# Patient Record
Sex: Male | Born: 1960 | Race: White | Hispanic: No | Marital: Married | Smoking: Former smoker
Health system: Southern US, Community
[De-identification: ages and names within clinical notes are randomized; demographics above are authoritative.]

## PROBLEM LIST (undated history)

## (undated) DIAGNOSIS — Z87442 Personal history of urinary calculi: Secondary | ICD-10-CM

## (undated) DIAGNOSIS — K219 Gastro-esophageal reflux disease without esophagitis: Secondary | ICD-10-CM

## (undated) DIAGNOSIS — N200 Calculus of kidney: Secondary | ICD-10-CM

## (undated) DIAGNOSIS — K4020 Bilateral inguinal hernia, without obstruction or gangrene, not specified as recurrent: Secondary | ICD-10-CM

## (undated) HISTORY — PX: VASECTOMY: SHX75

## (undated) HISTORY — PX: HERNIA REPAIR: SHX51

---

## 2005-05-03 ENCOUNTER — Ambulatory Visit: Payer: Self-pay | Admitting: Unknown Physician Specialty

## 2005-05-10 ENCOUNTER — Ambulatory Visit: Payer: Self-pay | Admitting: Unknown Physician Specialty

## 2006-10-28 ENCOUNTER — Ambulatory Visit: Payer: Self-pay

## 2006-10-28 ENCOUNTER — Other Ambulatory Visit: Payer: Self-pay

## 2015-03-01 ENCOUNTER — Emergency Department
Admission: EM | Admit: 2015-03-01 | Discharge: 2015-03-01 | Disposition: A | Discharge status: Home / Self Care | Payer: 59 | Attending: Emergency Medicine | Admitting: Emergency Medicine

## 2015-03-01 ENCOUNTER — Emergency Department: Payer: 59

## 2015-03-01 ENCOUNTER — Encounter: Payer: Self-pay | Admitting: Urgent Care

## 2015-03-01 ENCOUNTER — Observation Stay
Admission: EM | Admit: 2015-03-01 | Discharge: 2015-03-02 | Disposition: A | Discharge status: Home / Self Care | Payer: 59 | Source: Home / Self Care | Attending: Emergency Medicine | Admitting: Emergency Medicine

## 2015-03-01 ENCOUNTER — Encounter: Payer: Self-pay | Admitting: *Deleted

## 2015-03-01 DIAGNOSIS — R109 Unspecified abdominal pain: Secondary | ICD-10-CM

## 2015-03-01 DIAGNOSIS — Z9852 Vasectomy status: Secondary | ICD-10-CM | POA: Diagnosis not present

## 2015-03-01 DIAGNOSIS — R918 Other nonspecific abnormal finding of lung field: Secondary | ICD-10-CM | POA: Insufficient documentation

## 2015-03-01 DIAGNOSIS — N200 Calculus of kidney: Secondary | ICD-10-CM

## 2015-03-01 DIAGNOSIS — N201 Calculus of ureter: Secondary | ICD-10-CM | POA: Diagnosis present

## 2015-03-01 DIAGNOSIS — N281 Cyst of kidney, acquired: Secondary | ICD-10-CM | POA: Insufficient documentation

## 2015-03-01 DIAGNOSIS — R509 Fever, unspecified: Secondary | ICD-10-CM | POA: Diagnosis not present

## 2015-03-01 DIAGNOSIS — K219 Gastro-esophageal reflux disease without esophagitis: Secondary | ICD-10-CM | POA: Diagnosis not present

## 2015-03-01 DIAGNOSIS — N132 Hydronephrosis with renal and ureteral calculous obstruction: Secondary | ICD-10-CM | POA: Insufficient documentation

## 2015-03-01 DIAGNOSIS — N179 Acute kidney failure, unspecified: Secondary | ICD-10-CM | POA: Diagnosis not present

## 2015-03-01 HISTORY — DX: Calculus of kidney: N20.0

## 2015-03-01 HISTORY — DX: Gastro-esophageal reflux disease without esophagitis: K21.9

## 2015-03-01 HISTORY — DX: Bilateral inguinal hernia, without obstruction or gangrene, not specified as recurrent: K40.20

## 2015-03-01 LAB — URINALYSIS COMPLETE WITH MICROSCOPIC (ARMC ONLY)
BILIRUBIN URINE: NEGATIVE
Bacteria, UA: NONE SEEN
Bacteria, UA: NONE SEEN
Bilirubin Urine: NEGATIVE
GLUCOSE, UA: 50 mg/dL — AB
Glucose, UA: NEGATIVE mg/dL
KETONES UR: NEGATIVE mg/dL
Leukocytes, UA: NEGATIVE
Leukocytes, UA: NEGATIVE
NITRITE: NEGATIVE
Nitrite: NEGATIVE
PH: 5 (ref 5.0–8.0)
PH: 5 (ref 5.0–8.0)
PROTEIN: NEGATIVE mg/dL
Protein, ur: NEGATIVE mg/dL
SPECIFIC GRAVITY, URINE: 1.009 (ref 1.005–1.030)
SQUAMOUS EPITHELIAL / LPF: NONE SEEN
Specific Gravity, Urine: 1.014 (ref 1.005–1.030)
Squamous Epithelial / LPF: NONE SEEN
WBC UA: NONE SEEN WBC/hpf (ref 0–5)

## 2015-03-01 LAB — BASIC METABOLIC PANEL
ANION GAP: 13 (ref 5–15)
Anion gap: 8 (ref 5–15)
BUN: 19 mg/dL (ref 6–20)
BUN: 22 mg/dL — AB (ref 6–20)
CALCIUM: 8.8 mg/dL — AB (ref 8.9–10.3)
CHLORIDE: 102 mmol/L (ref 101–111)
CO2: 23 mmol/L (ref 22–32)
CO2: 27 mmol/L (ref 22–32)
Calcium: 8.7 mg/dL — ABNORMAL LOW (ref 8.9–10.3)
Chloride: 103 mmol/L (ref 101–111)
Creatinine, Ser: 1.27 mg/dL — ABNORMAL HIGH (ref 0.61–1.24)
Creatinine, Ser: 2.03 mg/dL — ABNORMAL HIGH (ref 0.61–1.24)
GFR calc Af Amer: >60 mL/min (ref >60)
GFR calc non Af Amer: 35 mL/min — ABNORMAL LOW (ref >60)
GFR, EST AFRICAN AMERICAN: 40 mL/min — AB (ref >60)
GLUCOSE: 209 mg/dL — AB (ref 65–99)
Glucose, Bld: 134 mg/dL — ABNORMAL HIGH (ref 65–99)
POTASSIUM: 2.8 mmol/L — AB (ref 3.5–5.1)
POTASSIUM: 4 mmol/L (ref 3.5–5.1)
SODIUM: 139 mmol/L (ref 135–145)
SODIUM: 137 mmol/L (ref 135–145)

## 2015-03-01 LAB — CBC
HCT: 49.6 % (ref 40.0–52.0)
HEMATOCRIT: 47.5 % (ref 40.0–52.0)
HEMOGLOBIN: 15.9 g/dL (ref 13.0–18.0)
Hemoglobin: 16.8 g/dL (ref 13.0–18.0)
MCH: 29.9 pg (ref 26.0–34.0)
MCH: 29.3 pg (ref 26.0–34.0)
MCHC: 33.9 g/dL (ref 32.0–36.0)
MCHC: 33.6 g/dL (ref 32.0–36.0)
MCV: 88.3 fL (ref 80.0–100.0)
MCV: 87.3 fL (ref 80.0–100.0)
PLATELETS: 227 10*3/uL (ref 150–440)
Platelets: 164 10*3/uL (ref 150–440)
RBC: 5.62 MIL/uL (ref 4.40–5.90)
RBC: 5.43 MIL/uL (ref 4.40–5.90)
RDW: 13.5 % (ref 11.5–14.5)
RDW: 13.4 % (ref 11.5–14.5)
WBC: 13.9 10*3/uL — ABNORMAL HIGH (ref 3.8–10.6)
WBC: 14.2 10*3/uL — ABNORMAL HIGH (ref 3.8–10.6)

## 2015-03-01 MED ORDER — SODIUM CHLORIDE 0.9 % IV BOLUS (SEPSIS)
1000.0000 mL | Freq: Once | INTRAVENOUS | Status: AC
  Administered 2015-03-01: 1000 mL via INTRAVENOUS

## 2015-03-01 MED ORDER — OXYCODONE-ACETAMINOPHEN 5-325 MG PO TABS
ORAL_TABLET | ORAL | Status: AC
  Administered 2015-03-01: 1 via ORAL
  Filled 2015-03-01: qty 1

## 2015-03-01 MED ORDER — HYDROMORPHONE HCL 1 MG/ML IJ SOLN
1.0000 mg | Freq: Once | INTRAMUSCULAR | Status: AC
  Administered 2015-03-01: 1 mg via INTRAVENOUS

## 2015-03-01 MED ORDER — HYDROMORPHONE HCL 1 MG/ML IJ SOLN
INTRAMUSCULAR | Status: AC
  Administered 2015-03-01: 1 mg via INTRAVENOUS
  Filled 2015-03-01: qty 1

## 2015-03-01 MED ORDER — KETOROLAC TROMETHAMINE 30 MG/ML IJ SOLN
30.0000 mg | Freq: Once | INTRAMUSCULAR | Status: AC
  Administered 2015-03-01: 30 mg via INTRAVENOUS
  Filled 2015-03-01: qty 1

## 2015-03-01 MED ORDER — ONDANSETRON 4 MG PO TBDP
4.0000 mg | ORAL_TABLET | Freq: Once | ORAL | Status: AC
  Administered 2015-03-01: 4 mg via ORAL

## 2015-03-01 MED ORDER — MORPHINE SULFATE (PF) 4 MG/ML IV SOLN
4.0000 mg | Freq: Once | INTRAVENOUS | Status: AC
  Administered 2015-03-01: 4 mg via INTRAVENOUS

## 2015-03-01 MED ORDER — OXYCODONE-ACETAMINOPHEN 5-325 MG PO TABS: 1.0000 | ORAL_TABLET | Freq: Four times a day (QID) | ORAL | Status: DC | PRN

## 2015-03-01 MED ORDER — POTASSIUM CHLORIDE CRYS ER 20 MEQ PO TBCR
40.0000 meq | EXTENDED_RELEASE_TABLET | Freq: Once | ORAL | Status: AC
  Administered 2015-03-01: 40 meq via ORAL
  Filled 2015-03-01: qty 2

## 2015-03-01 MED ORDER — PROMETHAZINE HCL 25 MG/ML IJ SOLN
12.5000 mg | Freq: Once | INTRAMUSCULAR | Status: AC
  Administered 2015-03-01: 12.5 mg via INTRAVENOUS

## 2015-03-01 MED ORDER — ONDANSETRON HCL 4 MG/2ML IJ SOLN
4.0000 mg | Freq: Once | INTRAMUSCULAR | Status: AC
  Administered 2015-03-01: 4 mg via INTRAVENOUS

## 2015-03-01 MED ORDER — ONDANSETRON HCL 4 MG/2ML IJ SOLN
INTRAMUSCULAR | Status: AC
  Administered 2015-03-01: 4 mg via INTRAVENOUS
  Filled 2015-03-01: qty 2

## 2015-03-01 MED ORDER — ONDANSETRON 4 MG PO TBDP
ORAL_TABLET | ORAL | Status: AC
  Administered 2015-03-01: 4 mg via ORAL
  Filled 2015-03-01: qty 1

## 2015-03-01 MED ORDER — TAMSULOSIN HCL 0.4 MG PO CAPS: 0.4000 mg | ORAL_CAPSULE | Freq: Every day | ORAL | Status: DC

## 2015-03-01 MED ORDER — MORPHINE SULFATE (PF) 4 MG/ML IV SOLN
4.0000 mg | Freq: Once | INTRAVENOUS | Status: AC
  Administered 2015-03-01: 4 mg via INTRAVENOUS
  Filled 2015-03-01: qty 1

## 2015-03-01 MED ORDER — PROMETHAZINE HCL 25 MG/ML IJ SOLN
12.5000 mg | Freq: Once | INTRAMUSCULAR | Status: AC
  Administered 2015-03-01: 12.5 mg via INTRAVENOUS
  Filled 2015-03-01: qty 1

## 2015-03-01 MED ORDER — OXYCODONE-ACETAMINOPHEN 5-325 MG PO TABS
1.0000 | ORAL_TABLET | Freq: Once | ORAL | Status: AC
  Administered 2015-03-01: 1 via ORAL

## 2015-03-01 MED ORDER — ONDANSETRON 4 MG PO TBDP: 4.0000 mg | ORAL_TABLET | Freq: Three times a day (TID) | ORAL | Status: DC | PRN

## 2015-03-01 NOTE — ED Provider Notes (Signed)
[facility] Emergency Department Provider Note  ____________________________________________  Time seen: Approximately 152 AM  I have reviewed the triage vital signs and the nursing notes.   HISTORY  Chief Complaint Flank Pain    HPI CASON LUFFMAN is a 54 y.o. male who comes in with left flank pain started at 2330. The patient's wife reports that the pain started out of the blue. The patient also had some nausea and vomiting. He has never had any symptoms or pain like this in the past. The patient has not had any blood in his urine per the patient's wife. The pain radiates around his left flank and goes to his abdomen into his testicle. The patient's wife reports that it came on fast and within 30 minutes he was pale clammy and sweaty. The patient is in some significant tenderness 10 discomfort at this time. He denies any other significant abdominal pain shortness of breath or chest pain but reports that he is having some severe pain.   Past medical history GERD  There are no active problems to display for this patient.   Past surgical history Vasectomy Hernia repair  Current Outpatient Rx  Name  Route  Sig  Dispense  Refill  . ibuprofen (ADVIL,MOTRIN) 200 MG tablet   Oral   Take 200 mg by mouth every 6 (six) hours as needed.         Marland Kitchen omeprazole (PRILOSEC OTC) 20 MG tablet   Oral   Take 20 mg by mouth daily.           Allergies Review of patient's allergies indicates no known allergies.  No family history on file.  Social History Social History  Substance Use Topics  . Smoking status: Not on file  . Smokeless tobacco: Not on file  . Alcohol Use: Not on file    Review of Systems Constitutional: No fever/chills Eyes: No visual changes. ENT: No sore throat. Cardiovascular: Denies chest pain. Respiratory: Denies shortness of breath. Gastrointestinal: Abdominal pain, nausea, vomiting Genitourinary: Negative for  dysuria. Musculoskeletal: Flank pain Skin: Negative for rash. Neurological: Negative for headaches, focal weakness or numbness.  10-point ROS otherwise negative.  ____________________________________________   PHYSICAL EXAM:  VITAL SIGNS: ED Triage Vitals  Enc Vitals Group     BP 03/01/15 0107 157/99 mmHg     Pulse Rate 03/01/15 0107 86     Resp --      Temp --      Temp src --      SpO2 03/01/15 0107 96 %     Weight --      Height --      Head Cir --      Peak Flow --      Pain Score 03/01/15 0107 9     Pain Loc --      Pain Edu? --      Excl. in [place]? --     Constitutional: Alert and oriented. Well appearing and in severe distress. Eyes: Conjunctivae are normal. PERRL. EOMI. Head: Atraumatic. Nose: No congestion/rhinnorhea. Mouth/Throat: Mucous membranes are moist.  Oropharynx non-erythematous. Cardiovascular: Normal rate, regular rhythm. Grossly normal heart sounds.  Good peripheral circulation. Respiratory: Normal respiratory effort.  No retractions. Lungs CTAB. Gastrointestinal: Soft and nontender. No distention. Positive bowel sounds no significant left CVA tenderness to palpation Musculoskeletal: No lower extremity tenderness nor edema.   Neurologic:  Normal speech and language.  Skin:  Skin is warm, dry and intact.  Psychiatric: Mood and affect  are normal. .  ____________________________________________   LABS (all labs ordered are listed, but only abnormal results are displayed)  Labs Reviewed  BASIC METABOLIC PANEL - Abnormal; Notable for the following:    Potassium 2.8 (*)    Glucose, Bld 209 (*)    Creatinine, Ser 1.27 (*)    Calcium 8.8 (*)    All other components within normal limits  CBC - Abnormal; Notable for the following:    WBC 13.9 (*)    All other components within normal limits  URINALYSIS COMPLETEWITH MICROSCOPIC ([facility] ONLY)    ____________________________________________  EKG  None ____________________________________________  RADIOLOGY  CT renal stone study: 4 mm left UVJ stone with mild hydronephrosis, 8 mm right lower lobe pulmonary nodule and partly visible 73mm nodule along the right major fissure ____________________________________________   PROCEDURES  Procedure(s) performed: None  Critical Care performed: No  ____________________________________________   INITIAL IMPRESSION / ASSESSMENT AND PLAN / ED COURSE  Pertinent labs & imaging results that were available during my care of the patient were reviewed by me and considered in my medical decision making (see chart for details).  This is a 54 year old male who comes in with left flank pain. The patient appears to have a 4 mm kidney stone. He did receive 2 doses of morphine, 2 doses of Zofran, Phenergan and Dilaudid. The pain was improved after the dose of Dilaudid. I also give the patient a dose of Toradol to help. The patient's urine does not show any signs of infection he has some mild elevation of his creatinine but is not severe. He did have some hypokalemia but we did give the patient 40 mEq of potassium. The patient will be discharged home to follow-up with urology I will give him some Flomax and pain medicine. ____________________________________________   FINAL CLINICAL IMPRESSION(S) / ED DIAGNOSES  Final diagnoses:  Flank pain, acute      Loney Hering, MD 03/01/15 [phone]

## 2015-03-01 NOTE — ED Notes (Signed)
Patient returned from CT at this time. MD aware that patient back in room and WTBS.

## 2015-03-01 NOTE — ED Provider Notes (Signed)
[facility] Emergency Department Provider Note  ____________________________________________  Time seen: 12:30 AM  I have reviewed the triage vital signs and the nursing notes.   HISTORY  Chief Complaint Fever      HPI Garrett Horton is a 54 y.o. male with history of 4 mm left UVJ stone which was diagnosed yesterday here at [facility] returns to the emergency department today with history of fever which was noted today to be 99.9 axillary. Patient states that his current pain score is mild. Patient denies any nausea or vomiting. She denies any hematuria. Patient states that he took home a urine strainer last night and has not noted any passage of stone.     Past Medical History  Diagnosis Date  . GERD (gastroesophageal reflux disease)   . Kidney stones   . Hernia, inguinal, bilateral     There are no active problems to display for this patient.   Past Surgical History  Procedure Laterality Date  . Vasectomy    . Hernia repair    . Vasectomy      Current Outpatient Rx  Name  Route  Sig  Dispense  Refill  . ibuprofen (ADVIL,MOTRIN) 200 MG tablet   Oral   Take 200 mg by mouth every 6 (six) hours as needed.         Marland Kitchen omeprazole (PRILOSEC OTC) 20 MG tablet   Oral   Take 20 mg by mouth daily.         . ondansetron (ZOFRAN ODT) 4 MG disintegrating tablet   Oral   Take 1 tablet (4 mg total) by mouth every 8 (eight) hours as needed for nausea or vomiting.   20 tablet   0   . oxyCODONE-acetaminophen (ROXICET) 5-325 MG per tablet   Oral   Take 1 tablet by mouth every 6 (six) hours as needed.   12 tablet   0   . tamsulosin (FLOMAX) 0.4 MG CAPS capsule   Oral   Take 1 capsule (0.4 mg total) by mouth daily.   7 capsule   0     Allergies Review of patient's allergies indicates no known allergies.  No family history on file.  Social History Social History  Substance Use Topics  . Smoking status: Never Smoker   . Smokeless  tobacco: None  . Alcohol Use: Yes    Review of Systems  Constitutional: Negative for fever. Eyes: Negative for visual changes. ENT: Negative for sore throat. Cardiovascular: Negative for chest pain. Respiratory: Negative for shortness of breath. Gastrointestinal: Negative for abdominal pain, vomiting and diarrhea. Genitourinary: Negative for dysuria. Musculoskeletal: Negative for back pain. Skin: Negative for rash. Neurological: Negative for headaches, focal weakness or numbness.  10-point ROS otherwise negative.  ____________________________________________   PHYSICAL EXAM:  VITAL SIGNS: ED Triage Vitals  Enc Vitals Group     BP 03/01/15 2153 141/92 mmHg     Pulse Rate 03/01/15 2153 104     Resp 03/01/15 2153 18     Temp 03/01/15 2153 99.1 F (37.3 C)     Temp Source 03/01/15 2153 Oral     SpO2 03/01/15 2153 98 %     Weight 03/01/15 2153 192 lb (87.091 kg)     Height 03/01/15 2153 5\' 10"  (1.778 m)     Head Cir --      Peak Flow --      Pain Score 03/01/15 2155 6     Pain Loc --  Pain Edu? --      Excl. in [place]? --     Constitutional: Alert and oriented. Well appearing and in no distress. Eyes: Conjunctivae are normal. PERRL. Normal extraocular movements. ENT   Head: Normocephalic and atraumatic.   Nose: No congestion/rhinnorhea.   Mouth/Throat: Mucous membranes are moist.   Neck: No stridor. Cardiovascular: Normal rate, regular rhythm. Normal and symmetric distal pulses are present in all extremities. No murmurs, rubs, or gallops. Respiratory: Normal respiratory effort without tachypnea nor retractions. Breath sounds are clear and equal bilaterally. No wheezes/rales/rhonchi. Gastrointestinal: Soft and nontender. No distention. There is no CVA tenderness. Genitourinary: deferred Musculoskeletal: Nontender with normal range of motion in all extremities. No joint effusions.  No lower extremity tenderness nor edema. Neurologic:  Normal speech and  language. No gross focal neurologic deficits are appreciated. Speech is normal.  Skin:  Skin is warm, dry and intact. No rash noted. Psychiatric: Mood and affect are normal. Speech and behavior are normal. Patient exhibits appropriate insight and judgment.  ____________________________________________    LABS (pertinent positives/negatives)  Labs Reviewed  CBC - Abnormal; Notable for the following:    WBC 14.2 (*)    All other components within normal limits  BASIC METABOLIC PANEL - Abnormal; Notable for the following:    Glucose, Bld 134 (*)    BUN 22 (*)    Creatinine, Ser 2.03 (*)    Calcium 8.7 (*)    GFR calc non Af Amer 35 (*)    GFR calc Af Amer 40 (*)    All other components within normal limits  URINALYSIS COMPLETEWITH MICROSCOPIC ([facility] ONLY) - Abnormal; Notable for the following:    Color, Urine STRAW (*)    APPearance CLEAR (*)    Hgb urine dipstick 1+ (*)    All other components within normal limits        INITIAL IMPRESSION / ASSESSMENT AND PLAN / ED COURSE  Pertinent labs & imaging results that were available during my care of the patient were reviewed by me and considered in my medical decision making (see chart for details).  The patient's creatinine increased from 1.27 and 2.03. Given this information in addition to the patient being febrile patient was discussed with Dr. Bess Harvest urologist on-call. Dr. Tresa Moore will evaluate the patient emergency department with plans for a possible left ureteral stent. All clinical findings were discussed with the patient and his wife.  ____________________________________________   FINAL CLINICAL IMPRESSION(S) / ED DIAGNOSES  Final diagnoses:  Kidney stone on left side      Gregor Hams, MD 03/07/15 0745

## 2015-03-01 NOTE — Discharge Instructions (Signed)

## 2015-03-01 NOTE — ED Notes (Signed)
Patient presents to ED 26 in obvious distress. Patient with c/o LEFT flank pain with (+) N/V that began with acute onset tonight at [place]. Patient pale and grossly diaphoretic. Patient unable to remain in a stationary position. Pain with radiated from LEFT lower back into flank and down into testicle. There is no PMH significance for nephrolithiasis. On exam, patient with (+) CVA tenderness noted on the LEFT.

## 2015-03-01 NOTE — ED Notes (Signed)
RN in to administer MD ordered Toradol. Patient observed sleeping. Wife stepping out at this time - reports that patient has been "much more comfortable" and reports that he has been sleeping since the last medication dosing. Wife requesting coffee and to walk some; coffee provided and spouse directed to the lobby. RN returns to bedside. Patient in a RIGHT side lying position with NAD noted. Patient with even and non-labored respirations. Awakened prior to intervention. Pain at a 2/10 at present. Med given. No verbalized needs at this time. Will continue ton monitor.

## 2015-03-01 NOTE — ED Notes (Signed)

## 2015-03-01 NOTE — ED Notes (Signed)
Patient states he was here last night for left flank pain and was diagnosed with a kidney stone. Patient states he was told to come back if he had a fever. Patient states he had a temperature of 99.9 axillary via an animal thermometer according to his wife. Patient state he took one Percocet for flank pain tonight while waiting in the waiting room.

## 2015-03-01 NOTE — ED Notes (Signed)
Patient to CT for stone study at this time.

## 2015-03-01 NOTE — ED Notes (Signed)
MD made aware of potassium result of 2.8; acknowledged. No new orders at this time.

## 2015-03-02 ENCOUNTER — Emergency Department: Payer: 59 | Admitting: Anesthesiology

## 2015-03-02 ENCOUNTER — Encounter: Payer: Self-pay | Admitting: Anesthesiology

## 2015-03-02 ENCOUNTER — Encounter
Admission: EM | Disposition: A | Discharge status: Home / Self Care | Payer: Self-pay | Source: Home / Self Care | Attending: Emergency Medicine

## 2015-03-02 DIAGNOSIS — R109 Unspecified abdominal pain: Secondary | ICD-10-CM | POA: Diagnosis not present

## 2015-03-02 DIAGNOSIS — D72829 Elevated white blood cell count, unspecified: Secondary | ICD-10-CM | POA: Diagnosis not present

## 2015-03-02 DIAGNOSIS — R110 Nausea: Secondary | ICD-10-CM | POA: Diagnosis not present

## 2015-03-02 DIAGNOSIS — N179 Acute kidney failure, unspecified: Secondary | ICD-10-CM | POA: Diagnosis not present

## 2015-03-02 DIAGNOSIS — N132 Hydronephrosis with renal and ureteral calculous obstruction: Secondary | ICD-10-CM | POA: Diagnosis present

## 2015-03-02 DIAGNOSIS — N2889 Other specified disorders of kidney and ureter: Secondary | ICD-10-CM | POA: Diagnosis not present

## 2015-03-02 DIAGNOSIS — N201 Calculus of ureter: Secondary | ICD-10-CM | POA: Diagnosis not present

## 2015-03-02 HISTORY — PX: CYSTOSCOPY WITH STENT PLACEMENT: SHX5790

## 2015-03-02 HISTORY — PX: CYSTOSCOPY W/ RETROGRADES: SHX1426

## 2015-03-02 SURGERY — CYSTOSCOPY, WITH STENT INSERTION
Anesthesia: General | Site: Ureter | Laterality: Left | Wound class: Clean Contaminated

## 2015-03-02 MED ORDER — SODIUM CHLORIDE 0.9 % IR SOLN
Status: DC | PRN
  Administered 2015-03-02: 3000 mL

## 2015-03-02 MED ORDER — CEFTRIAXONE SODIUM 1 G IJ SOLR
1.0000 g | Freq: Once | INTRAMUSCULAR | Status: AC
  Administered 2015-03-02: 1 g via INTRAVENOUS
  Filled 2015-03-02: qty 10

## 2015-03-02 MED ORDER — OXYCODONE HCL 5 MG PO TABS: 5.0000 mg | ORAL_TABLET | Freq: Once | ORAL | Status: DC | PRN

## 2015-03-02 MED ORDER — OXYCODONE HCL 5 MG/5ML PO SOLN: 5.0000 mg | Freq: Once | ORAL | Status: DC | PRN

## 2015-03-02 MED ORDER — SENNOSIDES-DOCUSATE SODIUM 8.6-50 MG PO TABS
1.0000 | ORAL_TABLET | Freq: Two times a day (BID) | ORAL | Status: DC
  Administered 2015-03-02 (×2): 1 via ORAL
  Filled 2015-03-02 (×2): qty 1

## 2015-03-02 MED ORDER — DEXTROSE 5 % IV SOLN
1.0000 g | INTRAVENOUS | Status: DC
  Administered 2015-03-02: 1 g via INTRAVENOUS
  Filled 2015-03-02 (×2): qty 10

## 2015-03-02 MED ORDER — HYDROMORPHONE HCL 1 MG/ML IJ SOLN
0.5000 mg | INTRAMUSCULAR | Status: DC | PRN
  Administered 2015-03-02: 1 mg via INTRAVENOUS
  Filled 2015-03-02: qty 1

## 2015-03-02 MED ORDER — ONDANSETRON 4 MG PO TBDP: 4.0000 mg | ORAL_TABLET | Freq: Three times a day (TID) | ORAL | Status: DC | PRN

## 2015-03-02 MED ORDER — FENTANYL CITRATE (PF) 100 MCG/2ML IJ SOLN
INTRAMUSCULAR | Status: DC | PRN
  Administered 2015-03-02 (×2): 50 ug via INTRAVENOUS

## 2015-03-02 MED ORDER — ACETAMINOPHEN 500 MG PO TABS
1000.0000 mg | ORAL_TABLET | Freq: Three times a day (TID) | ORAL | Status: DC
  Administered 2015-03-02 (×2): 1000 mg via ORAL
  Filled 2015-03-02 (×2): qty 2

## 2015-03-02 MED ORDER — LIDOCAINE HCL (CARDIAC) 20 MG/ML IV SOLN
INTRAVENOUS | Status: DC | PRN
Start: 2015-03-02 — End: 2015-03-02
  Administered 2015-03-02: 30 mg via INTRAVENOUS

## 2015-03-02 MED ORDER — CIPROFLOXACIN HCL 500 MG PO TABS: 500.0000 mg | ORAL_TABLET | Freq: Two times a day (BID) | ORAL | Status: DC

## 2015-03-02 MED ORDER — TAMSULOSIN HCL 0.4 MG PO CAPS
0.4000 mg | ORAL_CAPSULE | Freq: Every day | ORAL | Status: DC
  Administered 2015-03-02: 0.4 mg via ORAL
  Filled 2015-03-02: qty 1

## 2015-03-02 MED ORDER — LACTATED RINGERS IV SOLN
INTRAVENOUS | Status: DC | PRN
  Administered 2015-03-02: 01:00:00 via INTRAVENOUS

## 2015-03-02 MED ORDER — OXYCODONE-ACETAMINOPHEN 5-325 MG PO TABS: 1.0000 | ORAL_TABLET | Freq: Four times a day (QID) | ORAL | Status: DC | PRN

## 2015-03-02 MED ORDER — FENTANYL CITRATE (PF) 100 MCG/2ML IJ SOLN: 25.0000 ug | INTRAMUSCULAR | Status: DC | PRN

## 2015-03-02 MED ORDER — SODIUM CHLORIDE 0.9 % IV SOLN
INTRAVENOUS | Status: DC | PRN
  Administered 2015-03-02: 02:00:00

## 2015-03-02 MED ORDER — MIDAZOLAM HCL 2 MG/2ML IJ SOLN
INTRAMUSCULAR | Status: DC | PRN
  Administered 2015-03-02: 1 mg via INTRAVENOUS

## 2015-03-02 MED ORDER — OMEPRAZOLE MAGNESIUM 20 MG PO TBEC: 20.0000 mg | DELAYED_RELEASE_TABLET | Freq: Every day | ORAL | Status: DC

## 2015-03-02 MED ORDER — KCL IN DEXTROSE-NACL 20-5-0.45 MEQ/L-%-% IV SOLN
INTRAVENOUS | Status: DC
  Administered 2015-03-02: 06:00:00 via INTRAVENOUS
  Filled 2015-03-02 (×3): qty 1000

## 2015-03-02 MED ORDER — PANTOPRAZOLE SODIUM 40 MG PO TBEC
40.0000 mg | DELAYED_RELEASE_TABLET | Freq: Every day | ORAL | Status: DC
  Administered 2015-03-02: 40 mg via ORAL
  Filled 2015-03-02: qty 1

## 2015-03-02 MED ORDER — PROPOFOL 10 MG/ML IV BOLUS
INTRAVENOUS | Status: DC | PRN
  Administered 2015-03-02: 170 mg via INTRAVENOUS

## 2015-03-02 MED ORDER — TAMSULOSIN HCL 0.4 MG PO CAPS: 0.4000 mg | ORAL_CAPSULE | Freq: Every day | ORAL | Status: DC

## 2015-03-02 SURGICAL SUPPLY — 23 items
BAG DRAIN URO TABLE W/ADPT NS (DRAPE) ×3 IMPLANT
BAG HAMPER (MISCELLANEOUS) IMPLANT
BAG URO CATCHER STRL LF (DRAPE) ×3 IMPLANT
CATH INTERMIT  6FR 70CM (CATHETERS) ×3 IMPLANT
CATH URETL OPEN END 6X70 (CATHETERS) ×3 IMPLANT
CLOTH BEACON ORANGE TIMEOUT ST (SAFETY) ×3 IMPLANT
GLOVE BIO SURGEON STRL SZ7.5 (GLOVE) ×3 IMPLANT
GLOVE BIOGEL M STRL SZ7.5 (GLOVE) ×3 IMPLANT
GOWN STRL REUS W/ TWL XL LVL3 (GOWN DISPOSABLE) ×1 IMPLANT
GOWN STRL REUS W/TWL LRG LVL3 (GOWN DISPOSABLE) ×6 IMPLANT
GOWN STRL REUS W/TWL XL LVL3 (GOWN DISPOSABLE) ×2
GUIDEWIRE ANG ZIPWIRE 038X150 (WIRE) ×3 IMPLANT
GUIDEWIRE STR DUAL SENSOR (WIRE) IMPLANT
IV NS IRRIG 3000ML ARTHROMATIC (IV SOLUTION) ×3 IMPLANT
KIT ROOM TURNOVER AP CYSTO (KITS) ×3 IMPLANT
MANIFOLD NEPTUNE II (INSTRUMENTS) ×3 IMPLANT
PACK CYSTO (CUSTOM PROCEDURE TRAY) ×3 IMPLANT
PAD ARMBOARD 7.5X6 YLW CONV (MISCELLANEOUS) ×3 IMPLANT
STENT URET 6FRX26 CONTOUR (STENTS) ×3 IMPLANT
SYR CONTROL 10ML LL (SYRINGE) ×3 IMPLANT
SYRINGE 10CC LL (SYRINGE) ×3 IMPLANT
TOWEL OR 17X26 4PK STRL BLUE (TOWEL DISPOSABLE) ×3 IMPLANT
WATER STERILE IRR 1000ML POUR (IV SOLUTION) ×3 IMPLANT

## 2015-03-02 NOTE — Brief Op Note (Signed)
03/01/2015 - 03/02/2015  1:52 AM  PATIENT:  Jesusita Oka  54 y.o. male  PRE-OPERATIVE DIAGNOSIS:  stones  POST-OPERATIVE DIAGNOSIS:  stones  PROCEDURE:  Procedure(s): CYSTOSCOPY WITH STENT PLACEMENT (Left)  SURGEON:  Surgeon(s) and Role:    * Alexis Frock, MD - Primary  PHYSICIAN ASSISTANT:   ASSISTANTS: none   ANESTHESIA:   general  EBL:     BLOOD ADMINISTERED:none  DRAINS: none   LOCAL MEDICATIONS USED:  NONE  SPECIMEN:  Source of Specimen:  left renal pelvis urine  DISPOSITION OF SPECIMEN:  microbilogy  COUNTS:  YES  TOURNIQUET:  * No tourniquets in log *  DICTATION: .Other Dictation: Dictation Number [id number]  PLAN OF CARE: Admit to inpatient   PATIENT DISPOSITION:  PACU - hemodynamically stable.   Delay start of Pharmacological VTE agent (>24hrs) due to surgical blood loss or risk of bleeding: yes

## 2015-03-02 NOTE — Anesthesia Preprocedure Evaluation (Addendum)
Anesthesia Evaluation  Patient identified by MRN, date of birth, ID band Patient awake    Reviewed: Allergy & Precautions, H&P , NPO status , Patient's Chart, lab work & pertinent test results  History of Anesthesia Complications Negative for: history of anesthetic complications  Airway Mallampati: II  TM Distance: >3 FB Neck ROM: full    Dental  (+) Poor Dentition   Pulmonary    Pulmonary exam normal breath sounds clear to auscultation       Cardiovascular Exercise Tolerance: Good (-) Past MI negative cardio ROS Normal cardiovascular exam Rhythm:regular Rate:Normal     Neuro/Psych negative neurological ROS  negative psych ROS   GI/Hepatic Neg liver ROS, GERD  Controlled,  Endo/Other  negative endocrine ROS  Renal/GU Renal diseasenegative Renal ROS  negative genitourinary   Musculoskeletal   Abdominal   Peds  Hematology negative hematology ROS (+)   Anesthesia Other Findings Past Medical History:   GERD (gastroesophageal reflux disease)                       Kidney stones                                                Hernia, inguinal, bilateral                                 Signs and symptoms suggestive of sleep apnea    Reproductive/Obstetrics negative OB ROS                            Anesthesia Physical Anesthesia Plan  ASA: III  Anesthesia Plan: General LMA   Post-op Pain Management:    Induction:   Airway Management Planned:   Additional Equipment:   Intra-op Plan:   Post-operative Plan:   Informed Consent: I have reviewed the patients History and Physical, chart, labs and discussed the procedure including the risks, benefits and alternatives for the proposed anesthesia with the patient or authorized representative who has indicated his/her understanding and acceptance.   Dental Advisory Given  Plan Discussed with: Anesthesiologist, CRNA and  Surgeon  Anesthesia Plan Comments:         Anesthesia Quick Evaluation

## 2015-03-02 NOTE — ED Notes (Signed)
Patient leaving ED at this time. Clothes and shoes with patient. Yellow colored metal wedding band given to wife.

## 2015-03-02 NOTE — ED Notes (Signed)
OR called to report that they were ready for the patient. Report given. Patient to be undressed and prepared for the OR.

## 2015-03-02 NOTE — Anesthesia Procedure Notes (Signed)
Procedure Name: LMA Insertion Date/Time: 03/02/2015 1:34 AM Performed by: Andria Frames Pre-anesthesia Checklist: Patient identified, Emergency Drugs available, Suction available, Patient being monitored and Timeout performed Patient Re-evaluated:Patient Re-evaluated prior to inductionOxygen Delivery Method: Circle system utilized Preoxygenation: Pre-oxygenation with 100% oxygen Intubation Type: IV induction Ventilation: Mask ventilation without difficulty LMA: LMA inserted LMA Size: 5.0 Number of attempts: 1 Placement Confirmation: positive ETCO2

## 2015-03-02 NOTE — H&P (Signed)
Garrett Horton is an 54 y.o. male.    Chief Complaint: Left Ureteral stone, Acute Renal Failure, Fever + Leukocytosis, Large Left Renal Cyst  HPI:   1 - Left Ureteral stone - 38mm left UVJ stone with moderate hydro by ER CT 8/67 on eval left colickly flank pain and nausea. Only addition stone Rt punctate. Given trial of medical therapy but no interval passage.  2 -  Acute Renal Failure - Cr 2.0 up from baseline <1.5 by ER labs 9/21. Left ureteral stone with hydro and nauseas / poor PO intake.  3 -  Fever + Leukocytosis - Fever to 100.5, WBC with tachycardia and leukocytosis 14k by repeat ER labs 9/21. UA withtou overt infectious paramaters, but no new cough, abd pain, or other localizing sympotms.  4 - Large Left Renal Cyst - 7cm left lower pole renal cyst by non-contrast CT 9/21 incidental on eval left ureteral stone.   5 - Right Lung Mass - incidetnal 2cm peri-hilar mass on stone CT 2016.   PMH sig for right inguinal hernia repair. NO CV disease. NO blood thinners.   Today " Garrett Horton " is seen for above.   Past Medical History  Diagnosis Date  . GERD (gastroesophageal reflux disease)   . Kidney stones   . Hernia, inguinal, bilateral     Past Surgical History  Procedure Laterality Date  . Vasectomy    . Hernia repair    . Vasectomy      No family history on file. Social History:  reports that he has never smoked. He does not have any smokeless tobacco history on file. He reports that he drinks alcohol. His drug history is not on file.  Allergies: No Known Allergies   (Not in a hospital admission)  Results for orders placed or performed during the hospital encounter of 03/01/15 (from the past 48 hour(s))  CBC     Status: Abnormal   Collection Time: 03/01/15 11:18 PM  Result Value Ref Range   WBC 14.2 (H) 3.8 - 10.6 K/uL   RBC 5.43 4.40 - 5.90 MIL/uL   Hemoglobin 15.9 13.0 - 18.0 g/dL   HCT 47.5 40.0 - 52.0 %   MCV 87.3 80.0 - 100.0 fL   MCH 29.3 26.0 - 34.0 pg   MCHC  33.6 32.0 - 36.0 g/dL   RDW 13.4 11.5 - 14.5 %   Platelets 164 150 - 440 K/uL  Basic metabolic panel     Status: Abnormal   Collection Time: 03/01/15 11:18 PM  Result Value Ref Range   Sodium 137 135 - 145 mmol/L   Potassium 4.0 3.5 - 5.1 mmol/L   Chloride 102 101 - 111 mmol/L   CO2 27 22 - 32 mmol/L   Glucose, Bld 134 (H) 65 - 99 mg/dL   BUN 22 (H) 6 - 20 mg/dL   Creatinine, Ser 2.03 (H) 0.61 - 1.24 mg/dL   Calcium 8.7 (L) 8.9 - 10.3 mg/dL   GFR calc non Af Amer 35 (L) >60 mL/min   GFR calc Af Amer 40 (L) >60 mL/min    Comment: (NOTE) The eGFR has been calculated using the CKD EPI equation. This calculation has not been validated in all clinical situations. eGFR's persistently <60 mL/min signify possible Chronic Kidney Disease.    Anion gap 8 5 - 15  Urinalysis complete, with microscopic ([facility] only)     Status: Abnormal   Collection Time: 03/01/15 11:18 PM  Result Value Ref Range   Color,  Urine STRAW (A) YELLOW   APPearance CLEAR (A) CLEAR   Glucose, UA NEGATIVE NEGATIVE mg/dL   Bilirubin Urine NEGATIVE NEGATIVE   Ketones, ur NEGATIVE NEGATIVE mg/dL   Specific Gravity, Urine 1.009 1.005 - 1.030   Hgb urine dipstick 1+ (A) NEGATIVE   pH 5.0 5.0 - 8.0   Protein, ur NEGATIVE NEGATIVE mg/dL   Nitrite NEGATIVE NEGATIVE   Leukocytes, UA NEGATIVE NEGATIVE   RBC / HPF 0-5 0 - 5 RBC/hpf   WBC, UA NONE SEEN 0 - 5 WBC/hpf   Bacteria, UA NONE SEEN NONE SEEN   Squamous Epithelial / LPF NONE SEEN NONE SEEN   Mucous PRESENT    Ct Renal Stone Study  03/01/2015   CLINICAL DATA:  Left flank pain with nausea and vomiting beginning acutely tonight.  EXAM: CT ABDOMEN AND PELVIS WITHOUT CONTRAST  TECHNIQUE: Multidetector CT imaging of the abdomen and pelvis was performed following the standard protocol without IV contrast.  COMPARISON:  None.  FINDINGS: Lower chest and abdominal wall:  Moderate sliding hiatal hernia.  Fatty bilateral inguinal hernia status post repair on the right.  8 mm  pulmonary nodule in the right lower lobe, with indistinct appearance potentially reflecting inflammatory process. Unexpected nodule extending from the right hilum, along the major fissure, 21 mm maximal axial dimension. The larger nodule its tubular appearing could be vascular or bronchial.  Hepatobiliary: No focal liver abnormality.No evidence of biliary obstruction or stone.  Pancreas: Unremarkable.  Spleen: Unremarkable.  Adrenals/Urinary Tract: Negative adrenals. Mild left hydroureteronephrosis secondary to a a 4 mm UVJ stone. Punctate interpolar right nephrolithiasis. No right hydronephrosis.  7 cm left lower pole renal cyst.  Unremarkable bladder.  Reproductive:No pathologic findings.  Stomach/Bowel: No obstruction. No appendicitis. Mild colonic diverticulosis.  Vascular/Lymphatic: No acute vascular abnormality. No mass or adenopathy.  Peritoneal: No ascites or pneumoperitoneum.  Musculoskeletal: No acute abnormalities.  IMPRESSION: 1. 4 mm left UVJ stone with mild hydronephrosis. 2. 8 mm right lower lobe pulmonary nodule and partly visible 21 mm nodule along the right major fissure. Recommend outpatient workup using chest CT with contrast. 3. Punctate right renal calculus. 4. Moderate hiatal hernia.   Electronically Signed   By: Monte Fantasia M.D.   On: 03/01/2015 03:11    Review of Systems  Constitutional: Positive for fever and malaise/fatigue.  HENT: Negative.   Eyes: Negative.   Respiratory: Negative.   Cardiovascular: Negative.   Gastrointestinal: Positive for nausea.  Genitourinary: Positive for flank pain.  Musculoskeletal: Negative.   Skin: Negative.   Neurological: Negative.   Endo/Heme/Allergies: Negative.   Psychiatric/Behavioral: Negative.     Blood pressure 141/92, pulse 104, temperature 99.1 F (37.3 C), temperature source Oral, resp. rate 18, height $RemoveBe'5\' 10"'cfmDvzGSt$  (1.778 m), weight 87.091 kg (192 lb), SpO2 98 %. Physical Exam  Constitutional: He appears well-developed.  HENT:   Head: Normocephalic.  Eyes: Pupils are equal, round, and reactive to light.  Neck: Normal range of motion.  Cardiovascular:  Regular tachycardia  Respiratory: Effort normal.  GI: Soft.  Genitourinary:  Mild left CVAT  Musculoskeletal: Normal range of motion.  Neurological: He is alert.  Skin: Skin is warm.  Psychiatric: He has a normal mood and affect. His behavior is normal. Judgment and thought content normal.     Assessment/Plan  1 - Left Ureteral stone - pt noow with sequelae of acute renal failure and fever. Emergent renal decompression reccomended with stent v. neph tube. Explained to pt that definitive stone management contraindicated as may  worsen in fection in acute setting. He wants to proceed with left ureteral stent. Will need observation afterwards and verify no fevers x 24 hrs or more before DC. Risks, benefits, alternatives discussed.  2 -  Acute Renal Failure - likely cobination pre/renal and left ureteral stone as per aobve.   3 -  Fever + Leukocytosis - concerning for infected obstructe dstone as per above. Will need new UA / UCX following left renal decompression and empiric ABX, prefer rocephin givin compromised GFR.  4 - Large Left Renal Cyst - will need IV contrast axial imaing in elective setting to verify non-neoplastic  5 - Right Lung Mass - DDx includes malignancy. Will need dedicated axial contrast imaging in outpatient setting to help rule out malignancy, this was reiterated to pt.   MANNY, THEODORE 03/02/2015, 12:10 AM

## 2015-03-02 NOTE — ED Notes (Signed)
Consent form placed on chart; not completed due to the fact that the consulting physician has not been to the ED to speak with patient.

## 2015-03-02 NOTE — Discharge Instructions (Signed)
DISCHARGE INSTRUCTIONS FOR KIDNEY STONE/URETERAL STENT   MEDICATIONS:  1.  Resume all your other meds from home - except do not take any extra narcotic pain meds that you may have at home.  2. Take Cipro twice daily for 10 days for your urinary tract infection  ACTIVITY:  1. No strenuous activity x 1week  2. No driving while on narcotic pain medications  3. Drink plenty of water  4. Continue to walk at home - you can still get blood clots when you are at home, so keep active, but don't over do it.  5. May return to work/school tomorrow or when you feel ready   BATHING:  1. You can shower and we recommend daily showers    SIGNS/SYMPTOMS TO CALL:  Please call us if you have a fever greater than 101.5, uncontrolled nausea/vomiting, uncontrolled pain, dizziness, unable to urinate, bloody urine, chest pain, shortness of breath, leg swelling, leg pain, redness around wound, drainage from wound, or any other concerns or questions.   You can reach [place] at [phone]  FOLLOW-UP:  1. You have an appointment in one week at [facility] for re-evaluation and planning of stone removal.

## 2015-03-02 NOTE — Discharge Summary (Signed)
Date of admission: 03/01/2015  Date of discharge: 03/02/2015  Admission diagnosis: infected and obstructed left ureteral stone  Discharge diagnosis: as above, s/p left ureteral stent placement  Secondary diagnoses:  Patient Active Problem List   Diagnosis Date Noted  . Ureteral stone with hydronephrosis 03/02/2015    History and Physical: For full details, please see admission history and physical. Briefly, Garrett Horton is a 54 y.o. year old patient with infected/obstructed left ureteral stone.  He was admitted after undergoing a left ureteral stent placement urgently from the ED.   Hospital Course: Patient tolerated the procedure well.  He was then transferred to the floor after an uneventful PACU stay.  His hospital course was uncomplicated.  On POD#0  he had met discharge criteria: was eating a regular diet, was up and ambulating independently,  pain was well controlled, was voiding without a catheter, and was ready to for discharge.  PE: Filed Vitals:   03/02/15 1257  BP: 146/64  Pulse: 74  Temp: 98.6 F (37 C)  Resp: 16    Intake/Output Summary (Last 24 hours) at 03/02/15 1730 Last data filed at 03/02/15 1400  Gross per 24 hour  Intake    500 ml  Output   1595 ml  Net  -1095 ml   NAD Left CVA tenderness Ext symmetric Laboratory values:   Recent Labs  03/01/15 0117 03/01/15 2318  WBC 13.9* 14.2*  HGB 16.8 15.9  HCT 49.6 47.5    Recent Labs  03/01/15 0117 03/01/15 2318  NA 139 137  K 2.8* 4.0  CL 103 102  CO2 23 27  GLUCOSE 209* 134*  BUN 19 22*  CREATININE 1.27* 2.03*  CALCIUM 8.8* 8.7*   No results for input(s): LABPT, INR in the last 72 hours. No results for input(s): LABURIN in the last 72 hours. No results found for this or any previous visit.  Disposition: Home  Discharge instruction: The patient was instructed to be ambulatory but told to refrain from heavy lifting, strenuous activity, or driving.   Discharge medications:   Medication  List    TAKE these medications        ciprofloxacin 500 MG tablet  Commonly known as:  CIPRO  Take 1 tablet (500 mg total) by mouth 2 (two) times daily.     ibuprofen 200 MG tablet  Commonly known as:  ADVIL,MOTRIN  Take 200 mg by mouth every 6 (six) hours as needed.     omeprazole 20 MG tablet  Commonly known as:  PRILOSEC OTC  Take 20 mg by mouth daily.     ondansetron 4 MG disintegrating tablet  Commonly known as:  ZOFRAN ODT  Take 1 tablet (4 mg total) by mouth every 8 (eight) hours as needed for nausea or vomiting.     oxyCODONE-acetaminophen 5-325 MG per tablet  Commonly known as:  ROXICET  Take 1-2 tablets by mouth every 6 (six) hours as needed.     tamsulosin 0.4 MG Caps capsule  Commonly known as:  FLOMAX  Take 1 capsule (0.4 mg total) by mouth daily.        Followup:      Follow-up Information    Follow up with [facility] In 1 week.   Contact information:   [address] [place] [phone]

## 2015-03-02 NOTE — Progress Notes (Signed)
Patient discharged to home as ordered. Patient instructed to make follow up appointment with urology in one week as ordered. Patient given prescriptions and discharge instructions as ordered. Patient is alert and oriented, ambulates without assistance. IV discontinued site without S/S of infiltration or infection. Patient taken home by his wife.

## 2015-03-02 NOTE — Anesthesia Postprocedure Evaluation (Signed)
  Anesthesia Post-op Note  Patient: Garrett Horton  Procedure(s) Performed: Procedure(s): CYSTOSCOPY WITH STENT PLACEMENT (Left) CYSTOSCOPY WITH RETROGRADE PYELOGRAM (Left)  Anesthesia type:General LMA  Patient location: PACU  Post pain: Pain level controlled  Post assessment: Post-op Vital signs reviewed, Patient's Cardiovascular Status Stable, Respiratory Function Stable, Patent Airway and No signs of Nausea or vomiting  Post vital signs: Reviewed and stable  Last Vitals:  Filed Vitals:   03/02/15 0830  BP: 128/80  Pulse: 77  Temp: 37 C  Resp: 18    Level of consciousness: awake, alert  and patient cooperative  Complications: No apparent anesthesia complications

## 2015-03-02 NOTE — Transfer of Care (Signed)
Immediate Anesthesia Transfer of Care Note  Patient: Garrett Horton  Procedure(s) Performed: Procedure(s): CYSTOSCOPY WITH STENT PLACEMENT (Left) CYSTOSCOPY WITH RETROGRADE PYELOGRAM (Left)  Patient Location: PACU  Anesthesia Type:General  Level of Consciousness: sedated and patient cooperative  Airway & Oxygen Therapy: Patient Spontanous Breathing  Post-op Assessment: Report given to RN and Post -op Vital signs reviewed and stable  Post vital signs: Reviewed and stable  Last Vitals:  Filed Vitals:   03/02/15 0057  BP: 141/93  Pulse: 82  Temp: 36.8 C  Resp: 18    Complications: No apparent anesthesia complications

## 2015-03-02 NOTE — Op Note (Signed)
NAMEKRYSTAL, Garrett Horton  MEDICAL RECORD NO.:  [id number]  LOCATION:  [facility]                         FACILITY:  [facility]  PHYSICIAN:  Alexis Frock, MD     DATE OF BIRTH:  03-12-1961  DATE OF PROCEDURE: 03/02/2015                              OPERATIVE REPORT  DIAGNOSES:  Left ureteral stone, fever and acute renal failure.  PROCEDURES: 1. Cystoscopy with left retrograde pyelogram and interpretation. 2. Left ureteral stent placement, 6 x 26, Contour, no tether.  ESTIMATED BLOOD LOSS:  Nil.  COMPLICATIONS:  None.  SPECIMENS:  Left renal pelvis urine for Gram stain and culture.  INDICATION:  Garrett Horton is a pleasant 54 year old gentleman without significant medical history, who was found on workup of left colicky flank pain yesterday and recently on ER to have a left distal ureteral stone and moderate hydronephrosis at that time.  He had no infectious parameters and was tolerating p.o. intake and he was given a trial of outpatient medical therapy.  He unfortunately represented on March 01, 2015, with refractory symptoms as well as new fevers and acute renal failure by C-arm studies and he had not passes the stone in the interval.  Options were discussed including recommended to do urgent renal decompression and he proceeded with this with left ureteral stent. Informed consent was obtained and placed in the medical record.  DESCRIPTION OF PROCEDURE:  The patient being Garrett Horton, was verified. Procedure being left ureteral stent placement was confirmed.  Procedure was carried out.  Time-out was performed.  Intravenous antibiotics were administered.  General LMA anesthesia introduced.  The patient was placed into a low lithotomy position and sterile field was created by prepping and draping the patient's penis, perineum, and proximal thighs using iodine x3.  Next, cystourethroscopy was performed using a 21- French rigid cystoscope with 30-degree  offset lens.  Inspection of the anterior and posterior urethra was unremarkable.  Inspection of the urinary bladder revealed no diverticula, calcifications, or papular lesions.  The left ureteral orifice was cannulated with a 5-French end- hole catheter and left retrograde pyelogram was obtained.  left retrograde pyelogram demonstrated a single left ureter with single- system left kidney.  There was moderate hydronephrosis to the level of the distal ureter.  Definitive filling defect and stone was not visualized, it was felt to likely represent just very distal stone placement.  Next, a 0.038 Sensor wire was advanced at the level of the renal pelvis, over which, a new 6 x 26 Contour-type stent was placed using the cystoscopic and fluoroscopic guidance.  Good proximal and distal deployment were noted.  With the open-ended catheter in the renal pelvis, a sample of renal pelvis urine was set aside for Gram stain and culture.  There was efflux of urine around into the distal end of the stent.  Bladder was emptied per cystoscope.  Procedure was terminated.  The patient tolerated the procedure well.  There were no immediate periprocedural complications.  The patient was taken to the postanesthesia care unit in stable condition with plan for observation and admission.          ______________________________ Alexis Frock, MD  TM/MEDQ  D:  03/02/2015  T:  03/02/2015  Job:  [id number]

## 2015-03-05 LAB — ANAEROBIC CULTURE

## 2015-03-08 ENCOUNTER — Ambulatory Visit (INDEPENDENT_AMBULATORY_CARE_PROVIDER_SITE_OTHER): Payer: 59 | Admitting: Urology

## 2015-03-08 ENCOUNTER — Encounter: Payer: Self-pay | Admitting: Urology

## 2015-03-08 VITALS — BP 133/93 | HR 100 | Ht 70.0 in | Wt 194.8 lb

## 2015-03-08 DIAGNOSIS — N132 Hydronephrosis with renal and ureteral calculous obstruction: Secondary | ICD-10-CM

## 2015-03-08 NOTE — Progress Notes (Signed)
03/08/2015 2:26 PM   Garrett Horton [birth year] [id number]  Referring provider: No referring provider defined for this encounter.  Chief Complaint  Patient presents with  . Nephrolithiasis    Discuss surgery - New patient    HPI: The patient is a 54 year old male who underwent left ureteral stent placement September 22 for an infected stone. He has done well since that time. He has been afebrile. He still finishing a course of Cipro. His urine cultures were negative, but is unclear with initial culture was taken in relation to antibiotic dosing.   PMH: Past Medical History  Diagnosis Date  . GERD (gastroesophageal reflux disease)   . Kidney stones   . Hernia, inguinal, bilateral     Surgical History: Past Surgical History  Procedure Laterality Date  . Vasectomy    . Hernia repair    . Cystoscopy with stent placement Left 03/02/2015    Procedure: CYSTOSCOPY WITH STENT PLACEMENT;  Surgeon: Alexis Frock, MD;  Location: [facility] ORS;  Service: Urology;  Laterality: Left;  . Cystoscopy w/ retrogrades Left 03/02/2015    Procedure: CYSTOSCOPY WITH RETROGRADE PYELOGRAM;  Surgeon: Alexis Frock, MD;  Location: [facility] OR[facility];  Service: Urology;  Laterality: Left;    Home Medications:    Medication List       This list is accurate as of: 03/08/15  2:26 PM.  Always use your most recent med list.               ciprofloxacin 500 MG tablet  Commonly known as:  CIPRO  Take 1 tablet (500 mg total) by mouth 2 (two) times daily.     ibuprofen 200 MG tablet  Commonly known as:  ADVIL,MOTRIN  Take 200 mg by mouth every 6 (six) hours as needed.     omeprazole 20 MG tablet  Commonly known as:  PRILOSEC OTC  Take 20 mg by mouth daily.     ondansetron 4 MG disintegrating tablet  Commonly known as:  ZOFRAN ODT  Take 1 tablet (4 mg total) by mouth every 8 (eight) hours as needed for nausea or vomiting.     oxyCODONE-acetaminophen 5-325 MG tablet  Commonly known as:  ROXICET  Take 1-2  tablets by mouth every 6 (six) hours as needed.     tamsulosin 0.4 MG Caps capsule  Commonly known as:  FLOMAX  Take 1 capsule (0.4 mg total) by mouth daily.        Allergies: No Known Allergies  Family History: Family History  Problem Relation Age of Onset  . Prostate cancer Neg Hx   . Bladder Cancer Neg Hx   . Nephrolithiasis Father   . Nephrolithiasis Brother     Social History:  reports that he has never smoked. He does not have any smokeless tobacco history on file. He reports that he drinks alcohol. He reports that he does not use illicit drugs.  ROS: UROLOGY Frequent Urination?: No Hard to postpone urination?: Yes Burning/pain with urination?: No Get up at night to urinate?: No Leakage of urine?: No Urine stream starts and stops?: Yes Trouble starting stream?: No Do you have to strain to urinate?: No Blood in urine?: No Urinary tract infection?: No Sexually transmitted disease?: No Injury to kidneys or bladder?: No Painful intercourse?: No Weak stream?: No Erection problems?: No Penile pain?: No  Gastrointestinal Nausea?: No Vomiting?: No Indigestion/heartburn?: Yes Diarrhea?: No Constipation?: No  Constitutional Fever: No Night sweats?: No Weight loss?: No Fatigue?: No  Skin Skin rash/lesions?: No  Itching?: No  Eyes Blurred vision?: No Double vision?: No  Ears/Nose/Throat Sore throat?: No Sinus problems?: No  Hematologic/Lymphatic Swollen glands?: No Easy bruising?: No  Cardiovascular Leg swelling?: No Chest pain?: No  Respiratory Cough?: No Shortness of breath?: No  Endocrine Excessive thirst?: No  Musculoskeletal Back pain?: No Joint pain?: No  Neurological Headaches?: No Dizziness?: No  Psychologic Depression?: No Anxiety?: No  Physical Exam: BP 133/93 mmHg  Pulse 100  Ht 5\' 10"  (1.778 m)  Wt 194 lb 12.8 oz (88.361 kg)  BMI 27.95 kg/m2  Constitutional:  Alert and oriented, No acute distress. HEENT: [place] AT,  moist mucus membranes.  Trachea midline, no masses. Cardiovascular: No clubbing, cyanosis, or edema. Respiratory: Normal respiratory effort, no increased work of breathing. GI: Abdomen is soft, nontender, nondistended, no abdominal masses GU: No CVA tenderness.  Skin: No rashes, bruises or suspicious lesions. Lymph: No cervical or inguinal adenopathy. Neurologic: Grossly intact, no focal deficits, moving all 4 extremities. Psychiatric: Normal mood and affect.  Laboratory Data: Lab Results  Component Value Date   WBC 14.2* 03/01/2015   HGB 15.9 03/01/2015   HCT 47.5 03/01/2015   MCV 87.3 03/01/2015   PLT 164 03/01/2015    Lab Results  Component Value Date   CREATININE 2.03* 03/01/2015    No results found for: PSA  No results found for: TESTOSTERONE  No results found for: HGBA1C  Urinalysis    Component Value Date/Time   COLORURINE STRAW* 03/01/2015 2318   APPEARANCEUR CLEAR* 03/01/2015 2318   LABSPEC 1.009 03/01/2015 2318   PHURINE 5.0 03/01/2015 2318   GLUCOSEU NEGATIVE 03/01/2015 2318   HGBUR 1+* 03/01/2015 2318   [place] NEGATIVE 03/01/2015 2318   [place] NEGATIVE 03/01/2015 2318   PROTEINUR NEGATIVE 03/01/2015 2318   NITRITE NEGATIVE 03/01/2015 2318   LEUKOCYTESUR NEGATIVE 03/01/2015 2318    Pertinent Imaging: Distal left ureteral stone  Assessment & Plan:    1. Left Distal Ureteral stone S/p left ureteral stent -Plan for OR for cystoscopy, left ureteroscopy, laser lithotripsy, stent exchange versus removal -Obtain urine culture today -Patient to complete previously prescribed ten-day course of Cipro  Follow up in [place], [place] [address], [place], [place] [address] [phone]

## 2015-03-09 ENCOUNTER — Telehealth: Payer: Self-pay | Admitting: Radiology

## 2015-03-09 NOTE — Telephone Encounter (Signed)
[facility] re: surgery scheduled 03/21/15.

## 2015-03-10 LAB — CULTURE, URINE COMPREHENSIVE

## 2015-03-10 NOTE — Telephone Encounter (Signed)
LMOM stating pt has pre-admit testing appt on 03/14/15 @8 :30 for surgery scheduled 03/21/15. Requested pt return call.

## 2015-03-13 LAB — MICROSCOPIC EXAMINATION: RENAL EPITHEL UA: NONE SEEN /HPF

## 2015-03-13 LAB — URINALYSIS, COMPLETE
BILIRUBIN UA: NEGATIVE
GLUCOSE, UA: NEGATIVE
KETONES UA: NEGATIVE
Nitrite, UA: NEGATIVE
UUROB: 0.2 mg/dL (ref 0.2–1.0)
pH, UA: 5 (ref 5.0–7.5)

## 2015-03-13 NOTE — Telephone Encounter (Signed)
Wife returned call. Notified her of surgery scheduled 03/21/15, pre-admit testing appt 03/14/15 @8 :30 and to call day prior to surgery for arrival time to [facility]. Advised that pt should be npo after mn day of surgery. Wife voiced understanding.

## 2015-03-14 ENCOUNTER — Encounter
Admission: RE | Admit: 2015-03-14 | Discharge: 2015-03-14 | Disposition: A | Discharge status: Home / Self Care | Payer: No Typology Code available for payment source | Source: Ambulatory Visit | Attending: Urology | Admitting: Urology

## 2015-03-14 DIAGNOSIS — Z01818 Encounter for other preprocedural examination: Secondary | ICD-10-CM | POA: Diagnosis present

## 2015-03-14 DIAGNOSIS — N201 Calculus of ureter: Secondary | ICD-10-CM | POA: Insufficient documentation

## 2015-03-14 LAB — SURGICAL PCR SCREEN
MRSA, PCR: NEGATIVE
STAPHYLOCOCCUS AUREUS: NEGATIVE

## 2015-03-14 NOTE — Pre-Procedure Instructions (Signed)
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

## 2015-03-14 NOTE — Patient Instructions (Signed)
  Your procedure is scheduled on: Tuesday Oct. 11, 2016 Report to Same Day Surgery. To find out your arrival time please call [phone] between 1PM - 3PM on Monday Oct. 10, 2016.  Remember: Instructions that are not followed completely may result in serious medical risk, up to and including death, or upon the discretion of your surgeon and anesthesiologist your surgery may need to be rescheduled.    _x___ 1. Do not eat food or drink liquids after midnight. No gum chewing or hard candies.     _x___ 2. No Alcohol for 24 hours before or after surgery.   ____ 3. Bring all medications with you on the day of surgery if instructed.    _x___ 4. Notify your doctor if there is any change in your medical condition     (cold, fever, infections).     Do not wear jewelry, make-up, hairpins, clips or nail polish.  Do not wear lotions, powders, or perfumes. You may wear deodorant.  Do not shave 48 hours prior to surgery. Men may shave face and neck.  Do not bring valuables to the hospital.    [facility] is not responsible for any belongings or valuables.               Contacts, dentures or bridgework may not be worn into surgery.  Leave your suitcase in the car. After surgery it may be brought to your room.  For patients admitted to the hospital, discharge time is determined by your treatment team.   Patients discharged the day of surgery will not be allowed to drive home.    Please read over the following fact sheets that you were given:   [facility] Preparing for Surgery  __x__ Take these medicines the morning of surgery with A SIP OF WATER:    1. tamsulosin (FLOMAX)  2. omeprazole (PRILOSEC OTC    ____ Fleet Enema (as directed)   ____ Use CHG Soap as directed  ____ Use inhalers on the day of surgery  ____ Stop metformin 2 days prior to surgery    ____ Take 1/2 of usual insulin dose the night before surgery and none on the morning of surgery.   ____ Stop Coumadin/Plavix/aspirin  on does not apply.  __x_Stop Anti-inflammatories Aleve or Ibuprofen  on today.  Can take Tylenol or Oxycodone  for pain if needed.   ____ Stop supplements until after surgery.    ____ Bring C-Pap to the hospital.

## 2015-03-21 ENCOUNTER — Encounter
Admission: RE | Disposition: A | Discharge status: Home / Self Care | Payer: Self-pay | Source: Ambulatory Visit | Attending: Urology

## 2015-03-21 ENCOUNTER — Ambulatory Visit: Payer: 59 | Admitting: Anesthesiology

## 2015-03-21 ENCOUNTER — Encounter: Payer: Self-pay | Admitting: *Deleted

## 2015-03-21 ENCOUNTER — Ambulatory Visit
Admission: RE | Admit: 2015-03-21 | Discharge: 2015-03-21 | Disposition: A | Discharge status: Home / Self Care | Payer: 59 | Source: Ambulatory Visit | Attending: Urology | Admitting: Urology

## 2015-03-21 DIAGNOSIS — N132 Hydronephrosis with renal and ureteral calculous obstruction: Secondary | ICD-10-CM | POA: Diagnosis not present

## 2015-03-21 DIAGNOSIS — N179 Acute kidney failure, unspecified: Secondary | ICD-10-CM | POA: Diagnosis not present

## 2015-03-21 DIAGNOSIS — N201 Calculus of ureter: Secondary | ICD-10-CM | POA: Diagnosis not present

## 2015-03-21 DIAGNOSIS — Z711 Person with feared health complaint in whom no diagnosis is made: Secondary | ICD-10-CM | POA: Diagnosis not present

## 2015-03-21 DIAGNOSIS — N136 Pyonephrosis: Secondary | ICD-10-CM | POA: Diagnosis not present

## 2015-03-21 DIAGNOSIS — K4020 Bilateral inguinal hernia, without obstruction or gangrene, not specified as recurrent: Secondary | ICD-10-CM | POA: Diagnosis not present

## 2015-03-21 DIAGNOSIS — D72829 Elevated white blood cell count, unspecified: Secondary | ICD-10-CM | POA: Insufficient documentation

## 2015-03-21 DIAGNOSIS — Z87891 Personal history of nicotine dependence: Secondary | ICD-10-CM | POA: Diagnosis not present

## 2015-03-21 DIAGNOSIS — K449 Diaphragmatic hernia without obstruction or gangrene: Secondary | ICD-10-CM | POA: Insufficient documentation

## 2015-03-21 DIAGNOSIS — Z87442 Personal history of urinary calculi: Secondary | ICD-10-CM | POA: Diagnosis not present

## 2015-03-21 DIAGNOSIS — R918 Other nonspecific abnormal finding of lung field: Secondary | ICD-10-CM | POA: Insufficient documentation

## 2015-03-21 DIAGNOSIS — K219 Gastro-esophageal reflux disease without esophagitis: Secondary | ICD-10-CM | POA: Diagnosis not present

## 2015-03-21 DIAGNOSIS — Z9852 Vasectomy status: Secondary | ICD-10-CM | POA: Diagnosis not present

## 2015-03-21 DIAGNOSIS — N281 Cyst of kidney, acquired: Secondary | ICD-10-CM | POA: Diagnosis not present

## 2015-03-21 HISTORY — PX: CYSTOSCOPY/URETEROSCOPY/HOLMIUM LASER/STENT PLACEMENT: SHX6546

## 2015-03-21 HISTORY — PX: CYSTOSCOPY W/ RETROGRADES: SHX1426

## 2015-03-21 SURGERY — CYSTOSCOPY/URETEROSCOPY/HOLMIUM LASER/STENT PLACEMENT
Anesthesia: General | Wound class: Clean Contaminated

## 2015-03-21 MED ORDER — FENTANYL CITRATE (PF) 100 MCG/2ML IJ SOLN
INTRAMUSCULAR | Status: DC | PRN
  Administered 2015-03-21: 100 ug via INTRAVENOUS

## 2015-03-21 MED ORDER — BELLADONNA ALKALOIDS-OPIUM 16.2-60 MG RE SUPP
RECTAL | Status: DC | PRN
  Administered 2015-03-21: 1 via RECTAL

## 2015-03-21 MED ORDER — LIDOCAINE HCL 2 % EX GEL
CUTANEOUS | Status: DC | PRN
  Administered 2015-03-21: 1

## 2015-03-21 MED ORDER — METOPROLOL TARTRATE 1 MG/ML IV SOLN
INTRAVENOUS | Status: DC | PRN
  Administered 2015-03-21: 1 mg via INTRAVENOUS

## 2015-03-21 MED ORDER — BELLADONNA ALKALOIDS-OPIUM 16.2-60 MG RE SUPP
RECTAL | Status: AC
  Filled 2015-03-21: qty 1

## 2015-03-21 MED ORDER — GLYCOPYRROLATE 0.2 MG/ML IJ SOLN
INTRAMUSCULAR | Status: DC | PRN
  Administered 2015-03-21: 0.2 mg via INTRAVENOUS

## 2015-03-21 MED ORDER — FENTANYL CITRATE (PF) 100 MCG/2ML IJ SOLN: 25.0000 ug | INTRAMUSCULAR | Status: DC | PRN

## 2015-03-21 MED ORDER — MIDAZOLAM HCL 2 MG/2ML IJ SOLN
INTRAMUSCULAR | Status: DC | PRN
  Administered 2015-03-21: 1 mg via INTRAVENOUS

## 2015-03-21 MED ORDER — LACTATED RINGERS IV SOLN
INTRAVENOUS | Status: DC
  Administered 2015-03-21: 12:00:00 via INTRAVENOUS

## 2015-03-21 MED ORDER — ONDANSETRON HCL 4 MG/2ML IJ SOLN
4.0000 mg | Freq: Once | INTRAMUSCULAR | Status: DC | PRN
Start: 2015-03-21 — End: 2015-03-21

## 2015-03-21 MED ORDER — PROPOFOL 10 MG/ML IV BOLUS
INTRAVENOUS | Status: DC | PRN
  Administered 2015-03-21: 100 mg via INTRAVENOUS

## 2015-03-21 MED ORDER — CIPROFLOXACIN IN D5W 400 MG/200ML IV SOLN
400.0000 mg | Freq: Two times a day (BID) | INTRAVENOUS | Status: DC
Start: 2015-03-21 — End: 2015-03-21
  Administered 2015-03-21: 400 mg via INTRAVENOUS

## 2015-03-21 MED ORDER — IOTHALAMATE MEGLUMINE 43 % IV SOLN
INTRAVENOUS | Status: DC | PRN
  Administered 2015-03-21: 15 mL

## 2015-03-21 MED ORDER — LIDOCAINE HCL 2 % EX GEL
CUTANEOUS | Status: AC
  Filled 2015-03-21: qty 10

## 2015-03-21 MED ORDER — KETOROLAC TROMETHAMINE 30 MG/ML IJ SOLN
INTRAMUSCULAR | Status: DC | PRN
  Administered 2015-03-21: 30 mg via INTRAVENOUS

## 2015-03-21 MED ORDER — CIPROFLOXACIN HCL 500 MG PO TABS: 500.0000 mg | ORAL_TABLET | Freq: Once | ORAL | Status: DC

## 2015-03-21 MED ORDER — LIDOCAINE HCL (CARDIAC) 20 MG/ML IV SOLN
INTRAVENOUS | Status: DC | PRN
  Administered 2015-03-21: 100 mg via INTRAVENOUS

## 2015-03-21 MED ORDER — CIPROFLOXACIN IN D5W 400 MG/200ML IV SOLN
INTRAVENOUS | Status: AC
  Administered 2015-03-21 (×2): 400 mg via INTRAVENOUS
  Filled 2015-03-21: qty 200

## 2015-03-21 SURGICAL SUPPLY — 30 items
CATH FOL LX CONE TIP  8F (CATHETERS)
CATH FOL LX CONE TIP 8F (CATHETERS) IMPLANT
CATH URETL OPEN END 6X70 (CATHETERS) ×4 IMPLANT
CONRAY 43 FOR UROLOGY 50M (MISCELLANEOUS) ×4 IMPLANT
DRSG TEGADERM 2-3/8X2-3/4 SM (GAUZE/BANDAGES/DRESSINGS) ×4 IMPLANT
FEE TECHNICIAN ONLY PER HOUR (MISCELLANEOUS) IMPLANT
GLOVE BIO SURGEON STRL SZ7 (GLOVE) ×8 IMPLANT
GLOVE BIO SURGEON STRL SZ7.5 (GLOVE) ×4 IMPLANT
GOWN L4 LG 24 PK N/S (GOWN DISPOSABLE) ×4 IMPLANT
GOWN STRL REUS W/TWL XL LVL3 (GOWN DISPOSABLE) ×4 IMPLANT
GUIDEWIRE ANG ZIPWIRE 035X150 (WIRE) IMPLANT
INTRODUCER DILATOR DOUBLE (INTRODUCER) IMPLANT
KIT RM TURNOVER CYSTO AR (KITS) ×4 IMPLANT
LASER HOLMIUM FIBER SU 272UM (MISCELLANEOUS) IMPLANT
PACK CYSTO AR (MISCELLANEOUS) ×4 IMPLANT
PREP PVP WINGED SPONGE (MISCELLANEOUS) ×4 IMPLANT
SENSORWIRE 0.038 NOT ANGLED (WIRE) ×4
SET CYSTO W/LG BORE CLAMP LF (SET/KITS/TRAYS/PACK) ×4 IMPLANT
SOL .9 NS 3000ML IRR  AL (IV SOLUTION) ×2
SOL .9 NS 3000ML IRR UROMATIC (IV SOLUTION) ×2 IMPLANT
SOL PREP PVP 2OZ (MISCELLANEOUS) ×4
SOLUTION PREP PVP 2OZ (MISCELLANEOUS) ×2 IMPLANT
STENT URET 6FRX24 CONTOUR (STENTS) IMPLANT
STENT URET 6FRX26 CONTOUR (STENTS) ×4 IMPLANT
SURGILUBE 2OZ TUBE FLIPTOP (MISCELLANEOUS) ×4 IMPLANT
SWABSTK COMLB BENZOIN TINCTURE (MISCELLANEOUS) ×4 IMPLANT
SYRINGE IRR TOOMEY STRL 70CC (SYRINGE) ×4 IMPLANT
TUBING CONNECTING 10 (TUBING) ×3 IMPLANT
TUBING CONNECTING 10' (TUBING) ×1
WIRE SENSOR 0.038 NOT ANGLED (WIRE) ×2 IMPLANT

## 2015-03-21 NOTE — H&P (View-Only) (Signed)
Garrett Horton is an 54 y.o. male.    Chief Complaint: Left Ureteral stone, Acute Renal Failure, Fever + Leukocytosis, Large Left Renal Cyst  HPI:   1 - Left Ureteral stone - 51mm left UVJ stone with moderate hydro by ER CT 1/74 on eval left colickly flank pain and nausea. Only addition stone Rt punctate. Given trial of medical therapy but no interval passage.  2 -  Acute Renal Failure - Cr 2.0 up from baseline <1.5 by ER labs 9/21. Left ureteral stone with hydro and nauseas / poor PO intake.  3 -  Fever + Leukocytosis - Fever to 100.5, WBC with tachycardia and leukocytosis 14k by repeat ER labs 9/21. UA withtou overt infectious paramaters, but no new cough, abd pain, or other localizing sympotms.  4 - Large Left Renal Cyst - 7cm left lower pole renal cyst by non-contrast CT 9/21 incidental on eval left ureteral stone.   5 - Right Lung Mass - incidetnal 2cm peri-hilar mass on stone CT 2016.   PMH sig for right inguinal hernia repair. NO CV disease. NO blood thinners.   Today " [name] " is seen for above.   Past Medical History  Diagnosis Date  . GERD (gastroesophageal reflux disease)   . Kidney stones   . Hernia, inguinal, bilateral     Past Surgical History  Procedure Laterality Date  . Vasectomy    . Hernia repair    . Vasectomy      No family history on file. Social History:  reports that he has never smoked. He does not have any smokeless tobacco history on file. He reports that he drinks alcohol. His drug history is not on file.  Allergies: No Known Allergies   (Not in a hospital admission)  Results for orders placed or performed during the hospital encounter of 03/01/15 (from the past 48 hour(s))  CBC     Status: Abnormal   Collection Time: 03/01/15 11:18 PM  Result Value Ref Range   WBC 14.2 (H) 3.8 - 10.6 K/uL   RBC 5.43 4.40 - 5.90 MIL/uL   Hemoglobin 15.9 13.0 - 18.0 g/dL   HCT 47.5 40.0 - 52.0 %   MCV 87.3 80.0 - 100.0 fL   MCH 29.3 26.0 - 34.0 pg   MCHC  33.6 32.0 - 36.0 g/dL   RDW 13.4 11.5 - 14.5 %   Platelets 164 150 - 440 K/uL  Basic metabolic panel     Status: Abnormal   Collection Time: 03/01/15 11:18 PM  Result Value Ref Range   Sodium 137 135 - 145 mmol/L   Potassium 4.0 3.5 - 5.1 mmol/L   Chloride 102 101 - 111 mmol/L   CO2 27 22 - 32 mmol/L   Glucose, Bld 134 (H) 65 - 99 mg/dL   BUN 22 (H) 6 - 20 mg/dL   Creatinine, Ser 2.03 (H) 0.61 - 1.24 mg/dL   Calcium 8.7 (L) 8.9 - 10.3 mg/dL   GFR calc non Af Amer 35 (L) >60 mL/min   GFR calc Af Amer 40 (L) >60 mL/min    Comment: (NOTE) The eGFR has been calculated using the CKD EPI equation. This calculation has not been validated in all clinical situations. eGFR's persistently <60 mL/min signify possible Chronic Kidney Disease.    Anion gap 8 5 - 15  Urinalysis complete, with microscopic ([facility] only)     Status: Abnormal   Collection Time: 03/01/15 11:18 PM  Result Value Ref Range   Color,  Urine STRAW (A) YELLOW   APPearance CLEAR (A) CLEAR   Glucose, UA NEGATIVE NEGATIVE mg/dL   Bilirubin Urine NEGATIVE NEGATIVE   Ketones, ur NEGATIVE NEGATIVE mg/dL   Specific Gravity, Urine 1.009 1.005 - 1.030   Hgb urine dipstick 1+ (A) NEGATIVE   pH 5.0 5.0 - 8.0   Protein, ur NEGATIVE NEGATIVE mg/dL   Nitrite NEGATIVE NEGATIVE   Leukocytes, UA NEGATIVE NEGATIVE   RBC / HPF 0-5 0 - 5 RBC/hpf   WBC, UA NONE SEEN 0 - 5 WBC/hpf   Bacteria, UA NONE SEEN NONE SEEN   Squamous Epithelial / LPF NONE SEEN NONE SEEN   Mucous PRESENT    Ct Renal Stone Study  03/01/2015   CLINICAL DATA:  Left flank pain with nausea and vomiting beginning acutely tonight.  EXAM: CT ABDOMEN AND PELVIS WITHOUT CONTRAST  TECHNIQUE: Multidetector CT imaging of the abdomen and pelvis was performed following the standard protocol without IV contrast.  COMPARISON:  None.  FINDINGS: Lower chest and abdominal wall:  Moderate sliding hiatal hernia.  Fatty bilateral inguinal hernia status post repair on the right.  8 mm  pulmonary nodule in the right lower lobe, with indistinct appearance potentially reflecting inflammatory process. Unexpected nodule extending from the right hilum, along the major fissure, 21 mm maximal axial dimension. The larger nodule its tubular appearing could be vascular or bronchial.  Hepatobiliary: No focal liver abnormality.No evidence of biliary obstruction or stone.  Pancreas: Unremarkable.  Spleen: Unremarkable.  Adrenals/Urinary Tract: Negative adrenals. Mild left hydroureteronephrosis secondary to a a 4 mm UVJ stone. Punctate interpolar right nephrolithiasis. No right hydronephrosis.  7 cm left lower pole renal cyst.  Unremarkable bladder.  Reproductive:No pathologic findings.  Stomach/Bowel: No obstruction. No appendicitis. Mild colonic diverticulosis.  Vascular/Lymphatic: No acute vascular abnormality. No mass or adenopathy.  Peritoneal: No ascites or pneumoperitoneum.  Musculoskeletal: No acute abnormalities.  IMPRESSION: 1. 4 mm left UVJ stone with mild hydronephrosis. 2. 8 mm right lower lobe pulmonary nodule and partly visible 21 mm nodule along the right major fissure. Recommend outpatient workup using chest CT with contrast. 3. Punctate right renal calculus. 4. Moderate hiatal hernia.   Electronically Signed   By: Monte Fantasia M.D.   On: 03/01/2015 03:11    Review of Systems  Constitutional: Positive for fever and malaise/fatigue.  HENT: Negative.   Eyes: Negative.   Respiratory: Negative.   Cardiovascular: Negative.   Gastrointestinal: Positive for nausea.  Genitourinary: Positive for flank pain.  Musculoskeletal: Negative.   Skin: Negative.   Neurological: Negative.   Endo/Heme/Allergies: Negative.   Psychiatric/Behavioral: Negative.     Blood pressure 141/92, pulse 104, temperature 99.1 F (37.3 C), temperature source Oral, resp. rate 18, height $RemoveBe'5\' 10"'[id number]$  (1.778 m), weight 87.091 kg (192 lb), SpO2 98 %. Physical Exam  Constitutional: He appears well-developed.  HENT:   Head: Normocephalic.  Eyes: Pupils are equal, round, and reactive to light.  Neck: Normal range of motion.  Cardiovascular:  Regular tachycardia  Respiratory: Effort normal.  GI: Soft.  Genitourinary:  Mild left CVAT  Musculoskeletal: Normal range of motion.  Neurological: He is alert.  Skin: Skin is warm.  Psychiatric: He has a normal mood and affect. His behavior is normal. Judgment and thought content normal.     Assessment/Plan  1 - Left Ureteral stone - pt noow with sequelae of acute renal failure and fever. Emergent renal decompression reccomended with stent v. neph tube. Explained to pt that definitive stone management contraindicated as may  worsen in fection in acute setting. He wants to proceed with left ureteral stent. Will need observation afterwards and verify no fevers x 24 hrs or more before DC. Risks, benefits, alternatives discussed.  2 -  Acute Renal Failure - likely cobination pre/renal and left ureteral stone as per aobve.   3 -  Fever + Leukocytosis - concerning for infected obstructe dstone as per above. Will need new UA / UCX following left renal decompression and empiric ABX, prefer rocephin givin compromised GFR.  4 - Large Left Renal Cyst - will need IV contrast axial imaing in elective setting to verify non-neoplastic  5 - Right Lung Mass - DDx includes malignancy. Will need dedicated axial contrast imaging in outpatient setting to help rule out malignancy, this was reiterated to pt.   Cyriah Childrey 03/02/2015, 12:10 AM

## 2015-03-21 NOTE — Anesthesia Postprocedure Evaluation (Signed)
  Anesthesia Post-op Note  Patient: Garrett Horton  Procedure(s) Performed: Procedure(s): CYSTOSCOPY/URETEROSCOPY/STENT EXCHANGE  (Left) CYSTOSCOPY WITH RETROGRADE PYELOGRAM (N/A)  Anesthesia type:General  Patient location: PACU  Post pain: Pain level controlled  Post assessment: Post-op Vital signs reviewed, Patient's Cardiovascular Status Stable, Respiratory Function Stable, Patent Airway and No signs of Nausea or vomiting  Post vital signs: Reviewed and stable  Last Vitals:  Filed Vitals:   03/21/15 1236  BP: 125/88  Pulse: 85  Temp: 37.2 C  Resp: 9    Level of consciousness: awake, alert  and patient cooperative  Complications: No apparent anesthesia complications

## 2015-03-21 NOTE — Transfer of Care (Signed)
Immediate Anesthesia Transfer of Care Note  Patient: Garrett Horton  Procedure(s) Performed: Procedure(s): CYSTOSCOPY/URETEROSCOPY/STENT EXCHANGE  (Left) CYSTOSCOPY WITH RETROGRADE PYELOGRAM (N/A)  Patient Location: PACU  Anesthesia Type:General  Level of Consciousness: sedated  Airway & Oxygen Therapy: Patient Spontanous Breathing and Patient connected to nasal cannula oxygen  Post-op Assessment: Report given to RN and Post -op Vital signs reviewed and stable  Post vital signs: Reviewed and stable  Last Vitals:  Filed Vitals:   03/21/15 1236  BP: 125/88  Pulse: 85  Temp: 37.2 C  Resp: 9    Complications: No apparent anesthesia complications

## 2015-03-21 NOTE — Anesthesia Preprocedure Evaluation (Signed)
Anesthesia Evaluation  Patient identified by MRN, date of birth, ID band Patient awake    Reviewed: Allergy & Precautions, NPO status , Patient's Chart, lab work & pertinent test results  History of Anesthesia Complications Negative for: history of anesthetic complications  Airway Mallampati: III       Dental no notable dental hx.    Pulmonary former smoker,     + decreased breath sounds      Cardiovascular negative cardio ROS Normal cardiovascular exam     Neuro/Psych    GI/Hepatic Neg liver ROS, GERD  ,  Endo/Other  negative endocrine ROS  Renal/GU Renal disease     Musculoskeletal negative musculoskeletal ROS (+)   Abdominal   Peds negative pediatric ROS (+)  Hematology negative hematology ROS (+)   Anesthesia Other Findings   Reproductive/Obstetrics                             Anesthesia Physical Anesthesia Plan  ASA: II  Anesthesia Plan: General   Post-op Pain Management:    Induction: Intravenous  Airway Management Planned: LMA  Additional Equipment:   Intra-op Plan:   Post-operative Plan:   Informed Consent: I have reviewed the patients History and Physical, chart, labs and discussed the procedure including the risks, benefits and alternatives for the proposed anesthesia with the patient or authorized representative who has indicated his/her understanding and acceptance.     Plan Discussed with: CRNA  Anesthesia Plan Comments:         Anesthesia Quick Evaluation

## 2015-03-21 NOTE — Interval H&P Note (Signed)
History and Physical Interval Note: No changes to his H&P. AFVSS RRR CTA-B  Okay to proceed with scheduled surgery. 03/21/2015 11:28 AM  Garrett Horton  has presented today for surgery, with the diagnosis of URETERAL STONE  The various methods of treatment have been discussed with the patient and family. After consideration of risks, benefits and other options for treatment, the patient has consented to  Procedure(s): CYSTOSCOPY/URETEROSCOPY/HOLMIUM LASER/STENT PLACEMENT (Left) CYSTOSCOPY WITH RETROGRADE PYELOGRAM (N/A) as a surgical intervention .  The patient's history has been reviewed, patient examined, no change in status, stable for surgery.  I have reviewed the patient's chart and labs.  Questions were answered to the patient's satisfaction.     [name]

## 2015-03-21 NOTE — Anesthesia Procedure Notes (Signed)
Procedure Name: LMA Insertion Date/Time: 03/21/2015 11:54 AM Performed by: Rosaria Ferries, Arine Foley Pre-anesthesia Checklist: Patient identified, Emergency Drugs available, Suction available and Patient being monitored Patient Re-evaluated:Patient Re-evaluated prior to inductionOxygen Delivery Method: Circle system utilized Preoxygenation: Pre-oxygenation with 100% oxygen Intubation Type: IV induction LMA: LMA inserted LMA Size: 4.5 Number of attempts: 1 Tube secured with: Tape Dental Injury: Teeth and Oropharynx as per pre-operative assessment

## 2015-03-21 NOTE — Discharge Instructions (Signed)
DISCHARGE INSTRUCTIONS FOR KIDNEY STONE/URETERAL STENT   MEDICATIONS:  1.  Resume all your other meds from home - except do not take any extra narcotic pain meds that you may have at home.  2.  Take Cipro one hour prior to removal of your stent.   ACTIVITY:  1. No strenuous activity x 1week  2. No driving while on narcotic pain medications  3. Drink plenty of water  4. Continue to walk at home - you can still get blood clots when you are at home, so keep active, but don't over do it.  5. May return to work/school tomorrow or when you feel ready   BATHING:  1. You can shower and we recommend daily showers  2. You have a string coming from your urethra: The stent string is attached to your ureteral stent. Do not pull on this.   SIGNS/SYMPTOMS TO CALL:  Please call us if you have a fever greater than 101.5, uncontrolled nausea/vomiting, uncontrolled pain, dizziness, unable to urinate, bloody urine, chest pain, shortness of breath, leg swelling, leg pain, redness around wound, drainage from wound, or any other concerns or questions.   You can reach [place] at [phone].   FOLLOW-UP:  1. You have an appointment in 6 weeks with a ultrasound of your kidneys prior.   2. You have a string attached to your stent, you may remove it on Monday,  October 17th. To do this, pull the strings until the stents are completely removed. You may feel an odd sensation in your back.

## 2015-03-21 NOTE — Op Note (Signed)
Preoperative diagnosis: left ureteral calculus  Postoperative diagnosis: left ureteral calculus - passed, negative ureteroscopy  Procedure:  1. Cystoscopy 2. left ureteroscopy  3. left 61F x 26 ureteral stent exchange 4. left retrograde pyelography with interpretation  Surgeon: Ardis Hughs, MD  Anesthesia: General  Complications: None  Intraoperative findings: left retrograde pyelography demonstrated no filling defect within the left ureter without other abnormalities.  EBL: Minimal  Specimens: 1. None  Disposition of specimens: [facility] for stone analysis  Indication: Garrett Horton is a 54 y.o.   patient with urolithiasis. The patient presented to the emergency department several weeks prior with fever and flank pain. He was diagnosed with a 4 mm distal ureteral stone, UTI, pyelonephritis. After reviewing the management options for treatment, the patient elected to proceed with the above surgical procedure(s). We have discussed the potential benefits and risks of the procedure, side effects of the proposed treatment, the likelihood of the patient achieving the goals of the procedure, and any potential problems that might occur during the procedure or recuperation. Informed consent has been obtained.  Description of procedure:  The patient was taken to the operating room and general anesthesia was induced.  The patient was placed in the dorsal lithotomy position, prepped and draped in the usual sterile fashion, and preoperative antibiotics were administered. A preoperative time-out was performed.   Cystourethroscopy was performed.  The patient's urethra was examined and was normal  bilobar prostatic hypertrophy with a median lobe . The bladder was then systematically examined in its entirety. There was no evidence for any bladder tumors, stones, or other mucosal pathology.  The mucosa was irritated from the previous stent.  Attention then turned to the  left ureteral orifice and the stent emanating from the left ureteral orifice was grasped at the tip and pulled to the urethral meatus. I then advanced a 0.38 sensor wire through the stent and remove his stent over the wire.  The wire was then exchanged for a 5 [place] open ended ureteral catheter and omnipaque contrast was injected through the ureteral catheter and a retrograde pyelogram was performed with findings as dictated above.  A 0.38 sensor guidewire was then again advanced up the left ureter into the renal pelvis under fluoroscopic guidance. The 6 Fr semirigid ureteroscope was then advanced into the ureter next to the guidewire and close was identified. Scope was advanced up to the UPJ.  I then removed the rigid scope and advanced a flexible ureteroscope through the urethra into the left ureteral orifice under visual guidance. I then advanced the scope up into the renal pelvis and again injected contrast into the left collecting system and performed pyeloscopy under fluoroscopic guidance to ensure that the stone had not been pushed up into the collecting system. No stone was identified. The scope was then gently backed out, and reinspection of the ureter revealed no remaining visible stones or fragments.   The wire was then backloaded through the cystoscope and a ureteral stent was advance over the wire using Seldinger technique.  The stent was positioned appropriately under fluoroscopic and cystoscopic guidance.  The wire was then removed with an adequate stent curl noted in the renal pelvis as well as in the bladder.  The bladder was then emptied and the procedure ended.  The patient appeared to tolerate the procedure well and without complications.  The patient was able to be awakened and transferred to the recovery unit in satisfactory condition.   Disposition: The tether of the  stent was left on and secured to the ventral aspect of the patient's penis.  Instructions for removing the stent have  been provided to the patient. This has been scheduled for followup in 6 weeks with a renal ultrasound.

## 2015-06-25 ENCOUNTER — Emergency Department
Admission: EM | Admit: 2015-06-25 | Discharge: 2015-06-25 | Disposition: A | Discharge status: Home / Self Care | Payer: BLUE CROSS/BLUE SHIELD | Attending: Emergency Medicine | Admitting: Emergency Medicine

## 2015-06-25 ENCOUNTER — Emergency Department: Payer: BLUE CROSS/BLUE SHIELD

## 2015-06-25 ENCOUNTER — Encounter: Payer: Self-pay | Admitting: Emergency Medicine

## 2015-06-25 DIAGNOSIS — R109 Unspecified abdominal pain: Secondary | ICD-10-CM | POA: Diagnosis present

## 2015-06-25 DIAGNOSIS — Z79899 Other long term (current) drug therapy: Secondary | ICD-10-CM | POA: Diagnosis not present

## 2015-06-25 DIAGNOSIS — N23 Unspecified renal colic: Secondary | ICD-10-CM | POA: Diagnosis not present

## 2015-06-25 DIAGNOSIS — Z792 Long term (current) use of antibiotics: Secondary | ICD-10-CM | POA: Insufficient documentation

## 2015-06-25 LAB — COMPREHENSIVE METABOLIC PANEL
ALBUMIN: 4.1 g/dL (ref 3.5–5.0)
ALT: 30 U/L (ref 17–63)
AST: 23 U/L (ref 15–41)
Alkaline Phosphatase: 79 U/L (ref 38–126)
Anion gap: 7 (ref 5–15)
BUN: 24 mg/dL — ABNORMAL HIGH (ref 6–20)
CALCIUM: 8.8 mg/dL — AB (ref 8.9–10.3)
CHLORIDE: 107 mmol/L (ref 101–111)
CO2: 24 mmol/L (ref 22–32)
Creatinine, Ser: 1.27 mg/dL — ABNORMAL HIGH (ref 0.61–1.24)
GFR calc Af Amer: >60 mL/min (ref >60)
GFR calc non Af Amer: >60 mL/min (ref >60)
GLUCOSE: 218 mg/dL — AB (ref 65–99)
POTASSIUM: 3.4 mmol/L — AB (ref 3.5–5.1)
SODIUM: 138 mmol/L (ref 135–145)
TOTAL PROTEIN: 7.5 g/dL (ref 6.5–8.1)
Total Bilirubin: 0.7 mg/dL (ref 0.3–1.2)

## 2015-06-25 LAB — URINALYSIS COMPLETE WITH MICROSCOPIC (ARMC ONLY)
Bacteria, UA: NONE SEEN
Bilirubin Urine: NEGATIVE
GLUCOSE, UA: NEGATIVE mg/dL
LEUKOCYTES UA: NEGATIVE
NITRITE: NEGATIVE
Protein, ur: NEGATIVE mg/dL
SPECIFIC GRAVITY, URINE: 1.021 (ref 1.005–1.030)
Squamous Epithelial / LPF: NONE SEEN
pH: 5 (ref 5.0–8.0)

## 2015-06-25 LAB — CBC
HEMATOCRIT: 48.4 % (ref 40.0–52.0)
HEMOGLOBIN: 16 g/dL (ref 13.0–18.0)
MCH: 28.5 pg (ref 26.0–34.0)
MCHC: 32.9 g/dL (ref 32.0–36.0)
MCV: 86.5 fL (ref 80.0–100.0)
Platelets: 236 10*3/uL (ref 150–440)
RBC: 5.6 MIL/uL (ref 4.40–5.90)
RDW: 14 % (ref 11.5–14.5)
WBC: 11.5 10*3/uL — AB (ref 3.8–10.6)

## 2015-06-25 LAB — LIPASE, BLOOD: Lipase: 38 U/L (ref 11–51)

## 2015-06-25 MED ORDER — KETOROLAC TROMETHAMINE 30 MG/ML IJ SOLN: 30.0000 mg | Freq: Once | INTRAMUSCULAR | Status: DC

## 2015-06-25 MED ORDER — MORPHINE SULFATE (PF) 4 MG/ML IV SOLN
4.0000 mg | Freq: Once | INTRAVENOUS | Status: AC
  Administered 2015-06-25: 4 mg via INTRAVENOUS
  Filled 2015-06-25: qty 1

## 2015-06-25 MED ORDER — KETOROLAC TROMETHAMINE 10 MG PO TABS: 10.0000 mg | ORAL_TABLET | Freq: Three times a day (TID) | ORAL | Status: DC | PRN

## 2015-06-25 MED ORDER — ONDANSETRON HCL 4 MG PO TABS: 4.0000 mg | ORAL_TABLET | Freq: Three times a day (TID) | ORAL | Status: DC | PRN

## 2015-06-25 MED ORDER — HYDROMORPHONE HCL 1 MG/ML IJ SOLN
1.0000 mg | Freq: Once | INTRAMUSCULAR | Status: AC
  Administered 2015-06-25: 11:00:00 via INTRAVENOUS

## 2015-06-25 MED ORDER — SODIUM CHLORIDE 0.9 % IV BOLUS (SEPSIS)
1000.0000 mL | Freq: Once | INTRAVENOUS | Status: AC
  Administered 2015-06-25: 1000 mL via INTRAVENOUS

## 2015-06-25 MED ORDER — OXYCODONE-ACETAMINOPHEN 5-325 MG PO TABS: 1.0000 | ORAL_TABLET | ORAL | Status: DC | PRN

## 2015-06-25 MED ORDER — ONDANSETRON HCL 4 MG/2ML IJ SOLN
4.0000 mg | Freq: Once | INTRAMUSCULAR | Status: AC
  Administered 2015-06-25: 4 mg via INTRAVENOUS
  Filled 2015-06-25: qty 2

## 2015-06-25 MED ORDER — TAMSULOSIN HCL 0.4 MG PO CAPS: 0.4000 mg | ORAL_CAPSULE | Freq: Every day | ORAL | Status: DC

## 2015-06-25 MED ORDER — HYDROMORPHONE HCL 1 MG/ML IJ SOLN
INTRAMUSCULAR | Status: AC
  Filled 2015-06-25: qty 1

## 2015-06-25 NOTE — Discharge Instructions (Signed)
You're being treated for right-sided kidney stone. Return to the emergency department for any worsening condition including worsening pain, inability to urinate, fever, or any other symptoms concerning to you. If he still has pain after 1 week, you should follow back up with the urologist office.   Kidney Stones Kidney stones (urolithiasis) are deposits that form inside your kidneys. The intense pain is caused by the stone moving through the urinary tract. When the stone moves, the ureter goes into spasm around the stone. The stone is usually passed in the urine.  CAUSES   A disorder that makes certain neck glands produce too much parathyroid hormone (primary hyperparathyroidism).  A buildup of uric acid crystals, similar to gout in your joints.  Narrowing (stricture) of the ureter.  A kidney obstruction present at birth (congenital obstruction).  Previous surgery on the kidney or ureters.  Numerous kidney infections. SYMPTOMS   Feeling sick to your stomach (nauseous).  Throwing up (vomiting).  Blood in the urine (hematuria).  Pain that usually spreads (radiates) to the groin.  Frequency or urgency of urination. DIAGNOSIS   Taking a history and physical exam.  Blood or urine tests.  CT scan.  Occasionally, an examination of the inside of the urinary bladder (cystoscopy) is performed. TREATMENT   Observation.  Increasing your fluid intake.  Extracorporeal shock wave lithotripsy--This is a noninvasive procedure that uses shock waves to break up kidney stones.  Surgery may be needed if you have severe pain or persistent obstruction. There are various surgical procedures. Most of the procedures are performed with the use of small instruments. Only small incisions are needed to accommodate these instruments, so recovery time is minimized. The size, location, and chemical composition are all important variables that will determine the proper choice of action for you. Talk to  your health care provider to better understand your situation so that you will minimize the risk of injury to yourself and your kidney.  HOME CARE INSTRUCTIONS   Drink enough water and fluids to keep your urine clear or pale yellow. This will help you to pass the stone or stone fragments.  Strain all urine through the provided strainer. Keep all particulate matter and stones for your health care provider to see. The stone causing the pain may be as small as a grain of salt. It is very important to use the strainer each and every time you pass your urine. The collection of your stone will allow your health care provider to analyze it and verify that a stone has actually passed. The stone analysis will often identify what you can do to reduce the incidence of recurrences.  Only take over-the-counter or prescription medicines for pain, discomfort, or fever as directed by your health care provider.  Keep all follow-up visits as told by your health care provider. This is important.  Get follow-up X-rays if required. The absence of pain does not always mean that the stone has passed. It may have only stopped moving. If the urine remains completely obstructed, it can cause loss of kidney function or even complete destruction of the kidney. It is your responsibility to make sure X-rays and follow-ups are completed. Ultrasounds of the kidney can show blockages and the status of the kidney. Ultrasounds are not associated with any radiation and can be performed easily in a matter of minutes.  Make changes to your daily diet as told by your health care provider. You may be told to:  Limit the amount of salt that  you eat.  Eat 5 or more servings of fruits and vegetables each day.  Limit the amount of meat, poultry, fish, and eggs that you eat.  Collect a 24-hour urine sample as told by your health care provider.You may need to collect another urine sample every 6-12 months. SEEK MEDICAL CARE IF:  You  experience pain that is progressive and unresponsive to any pain medicine you have been prescribed. SEEK IMMEDIATE MEDICAL CARE IF:   Pain cannot be controlled with the prescribed medicine.  You have a fever or shaking chills.  The severity or intensity of pain increases over 18 hours and is not relieved by pain medicine.  You develop a new onset of abdominal pain.  You feel faint or pass out.  You are unable to urinate.   This information is not intended to replace advice given to you by your health care provider. Make sure you discuss any questions you have with your health care provider.   Document Released: 05/27/2005 Document Revised: 02/15/2015 Document Reviewed: 10/28/2012 Elsevier Interactive Patient Education [employer].

## 2015-06-25 NOTE — ED Notes (Signed)
Right side flank pain , sudden onset , pt diaphoretic, unable to get comfortable in any position

## 2015-06-25 NOTE — ED Notes (Signed)
Patient transported to Ultrasound 

## 2015-06-25 NOTE — ED Provider Notes (Signed)
[facility] Emergency Department Provider Note   ____________________________________________  Time seen: I have reviewed the triage vital signs and the triage nursing note.  HISTORY  Chief Complaint Flank Pain   Historian Patient  HPI Garrett Horton is a 55 y.o. male with a history of prior kidney stone which required stent placement in fall of 2016, is here with right flank pain that started this morning, was sudden in onset and severe and consistent with prior kidney stone pain. Denies current urinary symptoms. Denies fever. Positive for nausea, no vomiting. No bowel changes. Pain starts in the right flank and radiates into the right lower abdomen. He still has his appendix.  No exacerbating or alleviating symptoms.    Past Medical History  Diagnosis Date  . GERD (gastroesophageal reflux disease)   . Hernia, inguinal, bilateral   . Kidney stones   . Kidney stone   . Kidney stone     Patient Active Problem List   Diagnosis Date Noted  . Ureteral stone with hydronephrosis 03/02/2015    Past Surgical History  Procedure Laterality Date  . Vasectomy    . Hernia repair    . Cystoscopy with stent placement Left 03/02/2015    Procedure: CYSTOSCOPY WITH STENT PLACEMENT;  Surgeon: Alexis Frock, MD;  Location: [facility] OR[facility];  Service: Urology;  Laterality: Left;  . Cystoscopy w/ retrogrades Left 03/02/2015    Procedure: CYSTOSCOPY WITH RETROGRADE PYELOGRAM;  Surgeon: Alexis Frock, MD;  Location: [facility];  Service: Urology;  Laterality: Left;  . Cystoscopy/ureteroscopy/holmium laser/stent placement Left 03/21/2015    Procedure: CYSTOSCOPY/URETEROSCOPY/STENT EXCHANGE ;  Surgeon: Ardis Hughs, MD;  Location: [facility];  Service: Urology;  Laterality: Left;  . Cystoscopy w/ retrogrades N/A 03/21/2015    Procedure: CYSTOSCOPY WITH RETROGRADE PYELOGRAM;  Surgeon: Ardis Hughs, MD;  Location: [facility];  Service: Urology;  Laterality: N/A;    Current  Outpatient Rx  Name  Route  Sig  Dispense  Refill  . ciprofloxacin (CIPRO) 500 MG tablet   Oral   Take 1 tablet (500 mg total) by mouth once.   1 tablet   0   . ibuprofen (ADVIL,MOTRIN) 200 MG tablet   Oral   Take 200 mg by mouth every 6 (six) hours as needed.         Marland Kitchen ketorolac (TORADOL) 10 MG tablet   Oral   Take 1 tablet (10 mg total) by mouth every 8 (eight) hours as needed for moderate pain.   15 tablet   0   . omeprazole (PRILOSEC OTC) 20 MG tablet   Oral   Take 20 mg by mouth daily.         . ondansetron (ZOFRAN ODT) 4 MG disintegrating tablet   Oral   Take 1 tablet (4 mg total) by mouth every 8 (eight) hours as needed for nausea or vomiting. Patient not taking: Reported on 03/08/2015   20 tablet   0   . ondansetron (ZOFRAN) 4 MG tablet   Oral   Take 1 tablet (4 mg total) by mouth every 8 (eight) hours as needed for nausea or vomiting.   10 tablet   0   . oxyCODONE-acetaminophen (ROXICET) 5-325 MG tablet   Oral   Take 1 tablet by mouth every 4 (four) hours as needed for severe pain.   10 tablet   0   . tamsulosin (FLOMAX) 0.4 MG CAPS capsule   Oral   Take 1 capsule (0.4 mg total) by mouth  daily.   7 capsule   0     Allergies Review of patient's allergies indicates no known allergies.  Family History  Problem Relation Age of Onset  . Prostate cancer Neg Hx   . Bladder Cancer Neg Hx   . Nephrolithiasis Father   . Nephrolithiasis Brother     Social History Social History  Substance Use Topics  . Smoking status: Never Smoker   . Smokeless tobacco: Current User    Types: Snuff  . Alcohol Use: 0.0 oz/week    0 Standard drinks or equivalent per week     Comment: occasional=can go months with no alcohol, then 1-3 drinks in a month..    Review of Systems  Constitutional: Negative for fever. Eyes: Negative for visual changes. ENT: Negative for sore throat. Cardiovascular: Negative for chest pain. Respiratory: Negative for shortness of  breath. Gastrointestinal: Negative for  diarrhea. Genitourinary: Negative for dysuria. Musculoskeletal: Negative for back pain. Skin: Negative for rash. Neurological: Negative for headache. 10 point Review of Systems otherwise negative ____________________________________________   PHYSICAL EXAM:  VITAL SIGNS: ED Triage Vitals  Enc Vitals Group     BP 06/25/15 0957 174/104 mmHg     Pulse Rate 06/25/15 0957 60     Resp 06/25/15 0957 22     Temp 06/25/15 1043 97.6 F (36.4 C)     Temp Source 06/25/15 1043 Axillary     SpO2 06/25/15 0957 100 %     Weight 06/25/15 0957 195 lb (88.451 kg)     Height 06/25/15 0957 5\' 10"  (1.778 m)     Head Cir --      Peak Flow --      Pain Score 06/25/15 0959 10     Pain Loc --      Pain Edu? --      Excl. in [place]? --      Constitutional: Alert and oriented. Writhing around and almost tearful in pain. Eyes: Conjunctivae are normal. PERRL. Normal extraocular movements. ENT   Head: Normocephalic and atraumatic.   Nose: No congestion/rhinnorhea.   Mouth/Throat: Mucous membranes are moist.   Neck: No stridor. Cardiovascular/Chest: Normal rate, regular rhythm.  No murmurs, rubs, or gallops. Respiratory: Normal respiratory effort without tachypnea nor retractions. Breath sounds are clear and equal bilaterally. No wheezes/rales/rhonchi. Gastrointestinal: Soft. No distention, no guarding, no rebound. Nontender. Right flank pain and mild right abdominal pain to palpation.  Genitourinary/rectal:Deferred Musculoskeletal: Nontender with normal range of motion in all extremities. No joint effusions.  No lower extremity tenderness.  No edema. Neurologic:  Normal speech and language. No gross or focal neurologic deficits are appreciated. Skin:  Skin is warm, dry and intact. No rash noted. Psychiatric: Mood and affect are normal. Speech and behavior are normal. Patient exhibits appropriate insight and  judgment.  ____________________________________________   EKG I, Lisa Roca, MD, the attending physician have personally viewed and interpreted all ECGs.  None ____________________________________________  LABS (pertinent positives/negatives)  Lipase 38 Comprehensive metabolic panel significant for BUN 24 and creatinine 1.27 White blood count 11.5 and platelet count 236 hemoglobin is 16 Urinalysis 6-30 red blood cells and otherwise negative. There were 1+ ketones  ____________________________________________  RADIOLOGY All Xrays were viewed by me. Imaging interpreted by Radiologist.  Ultrasound renal:  IMPRESSION: Large minimally complicated cyst inferior pole LEFT kidney, little changed since prior CT.  Minimal collecting system dilatation at inferior pole of RIGHT kidney without renal pelvic dilatation. __________________________________________  PROCEDURES  Procedure(s) performed: None  Critical Care performed:  None  ____________________________________________   ED COURSE / ASSESSMENT AND PLAN  CONSULTATIONS: None  Pertinent labs & imaging results that were available during my care of the patient were reviewed by me and considered in my medical decision making (see chart for details).   Patient's clinical symptoms are consistent with kidney stone. His urinalysis does have hematuria. His BUN and creatinine are consistent with a prior creatinine of the same. There was one worse creatinine which I think was when he had his prior kidney stone.  Initial dose of morphine did not provide adequate pain control, but after 1 mg Dilaudid the patient was much more comfortable and was able to rest. We discussed not obtaining a CT today, but will get a renal ultrasound.  Patient should be okay for outpatient management. No sign of concomittent UTI.  Patient / Family / Caregiver informed of clinical course, medical decision-making process, and agree with plan.   I  discussed return precautions, follow-up instructions, and discharged instructions with patient and/or family.  ___________________________________________   FINAL CLINICAL IMPRESSION(S) / ED DIAGNOSES   Final diagnoses:  Ureteral colic              Note: This dictation was prepared with Dragon dictation. Any transcriptional errors that result from this process are unintentional   Lisa Roca, MD 06/25/15 1456

## 2016-12-07 IMAGING — CT CT RENAL STONE PROTOCOL
1 of 2 series · 15 of 32 positions shown, 19 images · non-contrast
Comparison: None.

CLINICAL DATA: Left flank pain with nausea and vomiting beginning
acutely tonight.

EXAM:
CT ABDOMEN AND PELVIS WITHOUT CONTRAST
TECHNIQUE: Multidetector CT imaging of the abdomen and pelvis was performed
following the standard protocol without IV contrast.

[Series 2: stone standard full · axial · 0.69mm/px · z∈[-820,-380]mm · 15 of 96 slices shown, 19 images]
[im 4/96  soft-tissue]
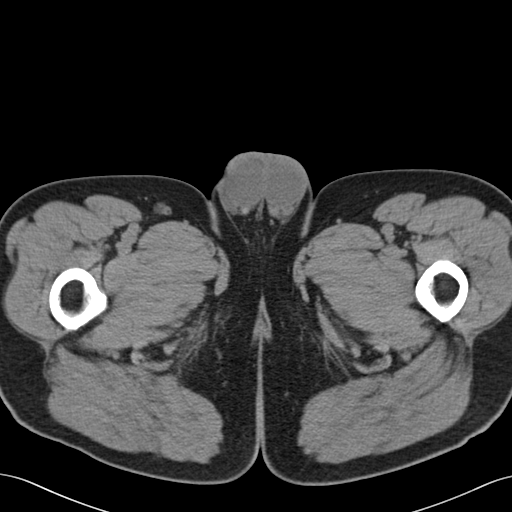
[im 4/96  bone]
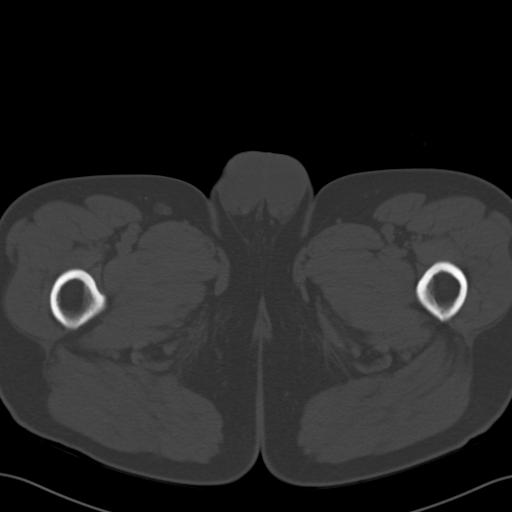
[im 12/96  soft-tissue]
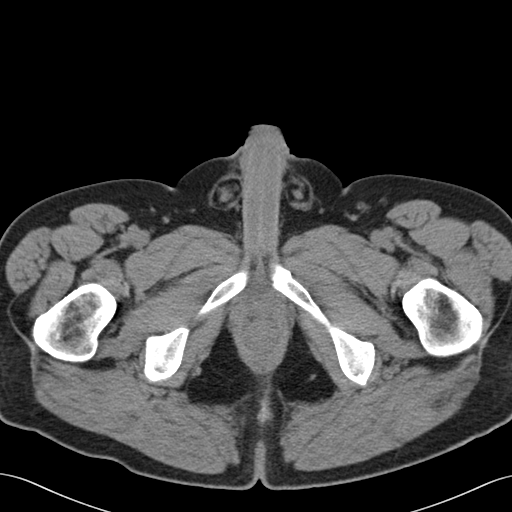
[im 20/96  soft-tissue]
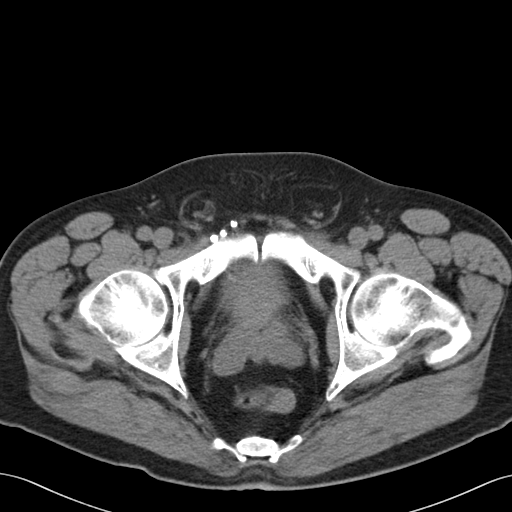
[im 27/96  soft-tissue]
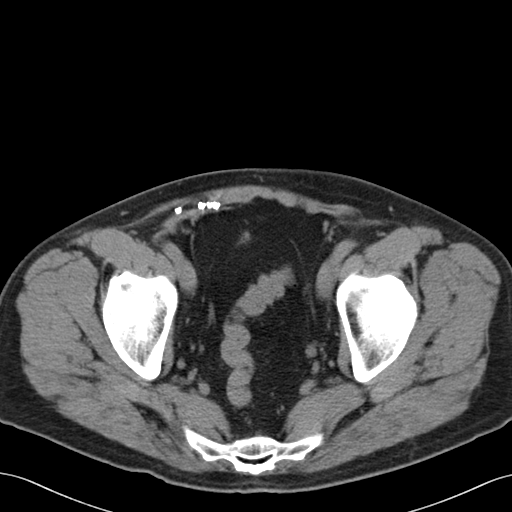
[im 35/96  soft-tissue]
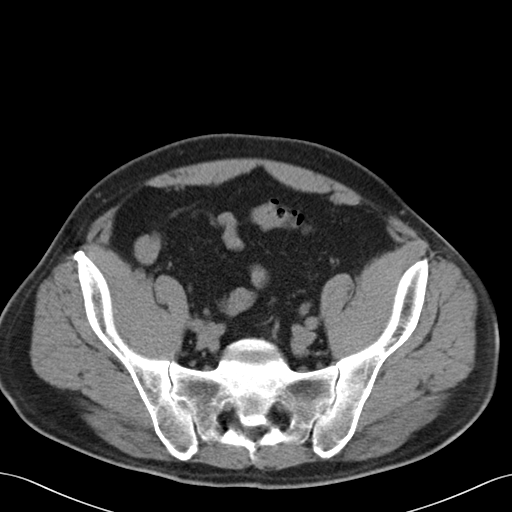
[im 42/96  soft-tissue]
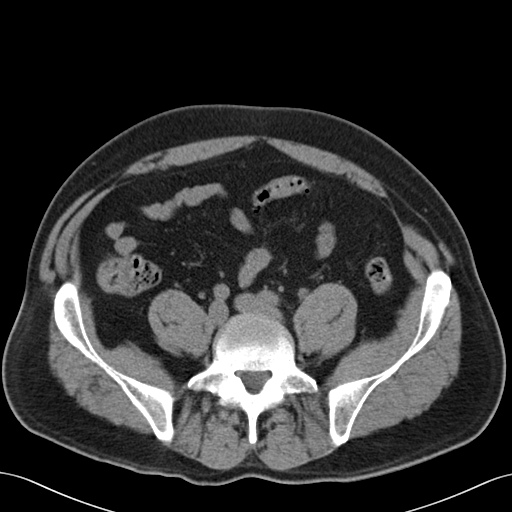
[im 50/96  soft-tissue]
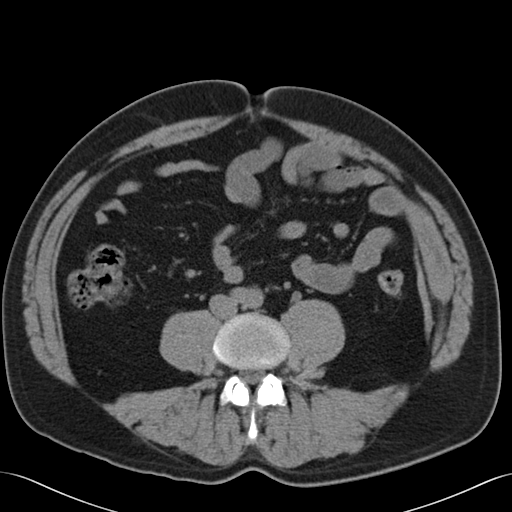
[im 54/96  soft-tissue]
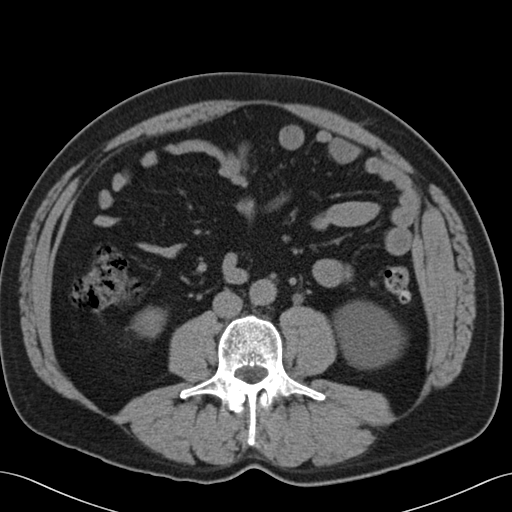
[im 61/96  soft-tissue]
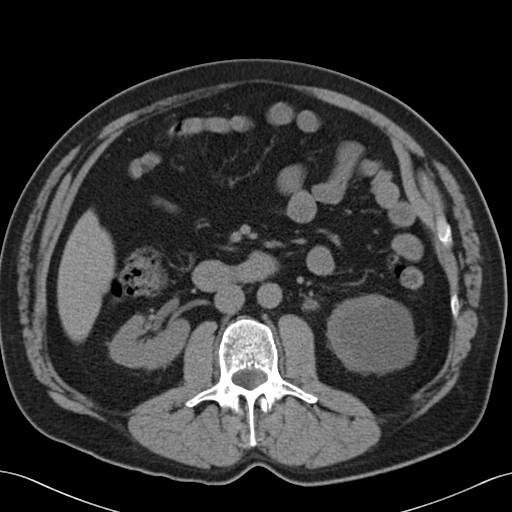
[im 61/96  bone]
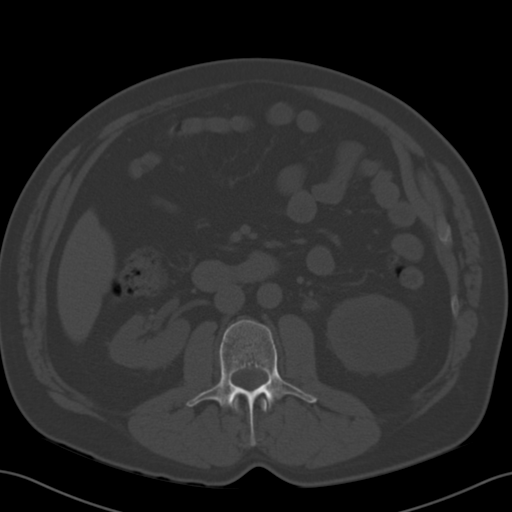
[im 69/96  soft-tissue]
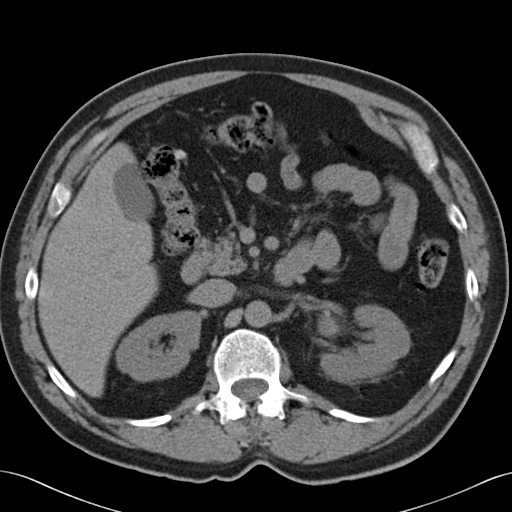
[im 77/96  soft-tissue]
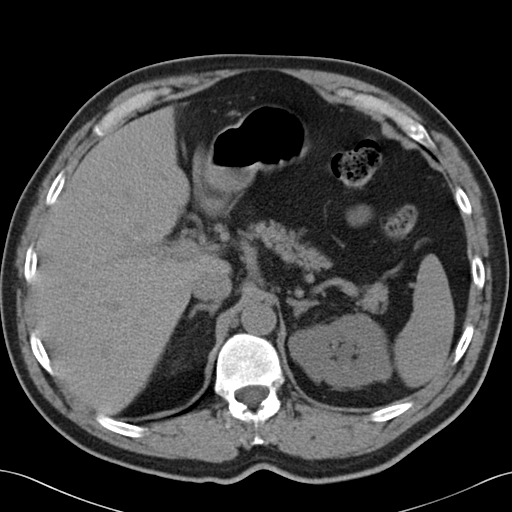
[im 80/96  lung]
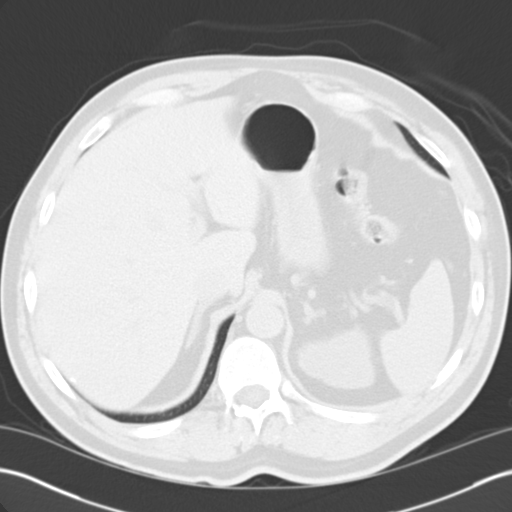
[im 84/96  soft-tissue]
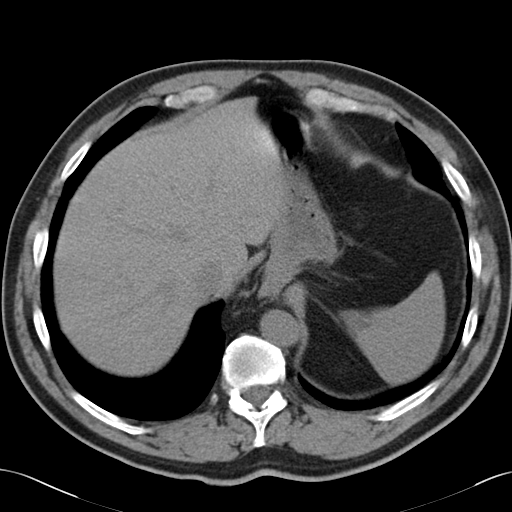
[im 84/96  lung]
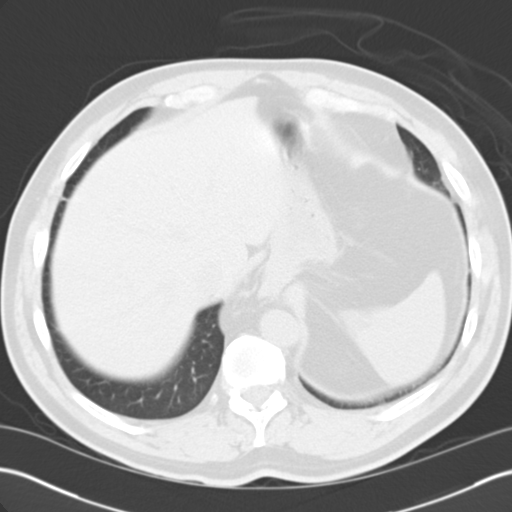
[im 88/96  lung]
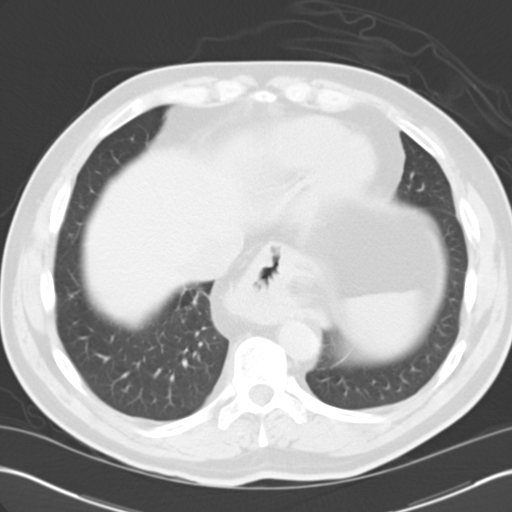
[im 92/96  soft-tissue]
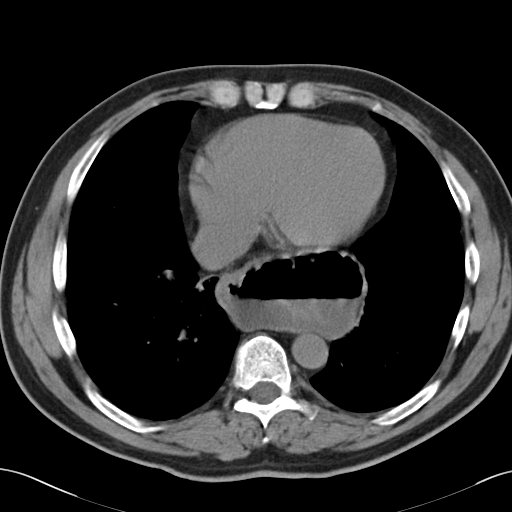
[im 92/96  lung]
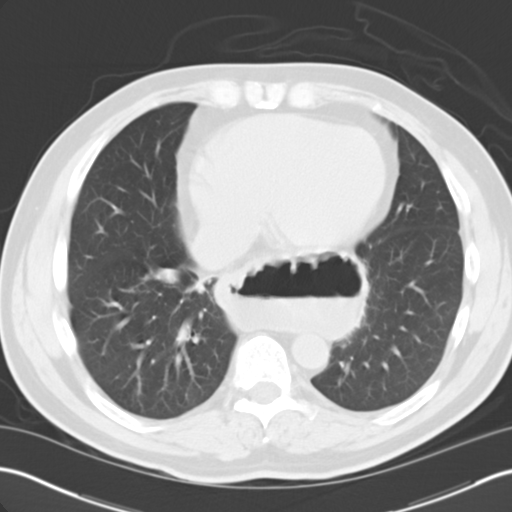

[15 of 32 positions shown; findings below may reference images not displayed]

FINDINGS: Lower chest and abdominal wall:  Moderate sliding hiatal hernia.

Fatty bilateral inguinal hernia status post repair on the right.

8 mm pulmonary nodule in the right lower lobe, with indistinct
appearance potentially reflecting inflammatory process. Unexpected
nodule extending from the right hilum, along the major fissure, 21
mm maximal axial dimension. The larger nodule its tubular appearing
could be vascular or bronchial.

Hepatobiliary: No focal liver abnormality.No evidence of biliary
obstruction or stone.

Pancreas: Unremarkable.

Spleen: Unremarkable.

Adrenals/Urinary Tract: Negative adrenals. Mild left
hydroureteronephrosis secondary to a a 4 mm UVJ stone. Punctate
interpolar right nephrolithiasis. No right hydronephrosis.

7 cm left lower pole renal cyst.

Unremarkable bladder.

Reproductive:No pathologic findings.

Stomach/Bowel: No obstruction. No appendicitis. Mild colonic
diverticulosis.

Vascular/Lymphatic: No acute vascular abnormality. No mass or
adenopathy.

Peritoneal: No ascites or pneumoperitoneum.

Musculoskeletal: No acute abnormalities.
IMPRESSION: 1. 4 mm left UVJ stone with mild hydronephrosis.
2. 8 mm right lower lobe pulmonary nodule and partly visible 21 mm
nodule along the right major fissure. Recommend outpatient workup
using chest CT with contrast.
3. Punctate right renal calculus.
4. Moderate hiatal hernia.

## 2018-07-19 ENCOUNTER — Emergency Department
Admission: EM | Admit: 2018-07-19 | Discharge: 2018-07-19 | Disposition: A | Discharge status: Home / Self Care | Payer: 59 | Attending: Emergency Medicine | Admitting: Emergency Medicine

## 2018-07-19 ENCOUNTER — Emergency Department: Payer: 59

## 2018-07-19 ENCOUNTER — Other Ambulatory Visit: Payer: Self-pay

## 2018-07-19 DIAGNOSIS — K449 Diaphragmatic hernia without obstruction or gangrene: Secondary | ICD-10-CM | POA: Insufficient documentation

## 2018-07-19 DIAGNOSIS — K219 Gastro-esophageal reflux disease without esophagitis: Secondary | ICD-10-CM | POA: Diagnosis not present

## 2018-07-19 DIAGNOSIS — R0602 Shortness of breath: Secondary | ICD-10-CM | POA: Insufficient documentation

## 2018-07-19 DIAGNOSIS — R42 Dizziness and giddiness: Secondary | ICD-10-CM | POA: Insufficient documentation

## 2018-07-19 DIAGNOSIS — R1013 Epigastric pain: Secondary | ICD-10-CM | POA: Diagnosis present

## 2018-07-19 LAB — COMPREHENSIVE METABOLIC PANEL
ALT: 25 U/L (ref 0–44)
ANION GAP: 7 (ref 5–15)
AST: 17 U/L (ref 15–41)
Albumin: 3.8 g/dL (ref 3.5–5.0)
Alkaline Phosphatase: 76 U/L (ref 38–126)
BUN: 12 mg/dL (ref 6–20)
CALCIUM: 9.2 mg/dL (ref 8.9–10.3)
CO2: 27 mmol/L (ref 22–32)
Chloride: 104 mmol/L (ref 98–111)
Creatinine, Ser: 0.89 mg/dL (ref 0.61–1.24)
GLUCOSE: 117 mg/dL — AB (ref 70–99)
POTASSIUM: 3.8 mmol/L (ref 3.5–5.1)
Sodium: 138 mmol/L (ref 135–145)
TOTAL PROTEIN: 7.3 g/dL (ref 6.5–8.1)
Total Bilirubin: 1.4 mg/dL — ABNORMAL HIGH (ref 0.3–1.2)

## 2018-07-19 LAB — CBC
HCT: 50 % (ref 39.0–52.0)
Hemoglobin: 16.6 g/dL (ref 13.0–17.0)
MCH: 29.2 pg (ref 26.0–34.0)
MCHC: 33.2 g/dL (ref 30.0–36.0)
MCV: 87.9 fL (ref 80.0–100.0)
PLATELETS: 223 10*3/uL (ref 150–400)
RBC: 5.69 MIL/uL (ref 4.22–5.81)
RDW: 12.5 % (ref 11.5–15.5)
WBC: 14.7 10*3/uL — ABNORMAL HIGH (ref 4.0–10.5)
nRBC: 0 % (ref 0.0–0.2)

## 2018-07-19 LAB — TROPONIN I: Troponin I: <0.03 ng/mL (ref <0.03)

## 2018-07-19 LAB — LIPASE, BLOOD: Lipase: 36 U/L (ref 11–51)

## 2018-07-19 MED ORDER — SUCRALFATE 1 G PO TABS: 1.0000 g | ORAL_TABLET | Freq: Four times a day (QID) | ORAL | 1 refills | Status: DC

## 2018-07-19 MED ORDER — SODIUM CHLORIDE 0.9 % IV SOLN
Freq: Once | INTRAVENOUS | Status: AC
  Administered 2018-07-19: 10:00:00 via INTRAVENOUS

## 2018-07-19 MED ORDER — ONDANSETRON HCL 4 MG/2ML IJ SOLN
4.0000 mg | Freq: Once | INTRAMUSCULAR | Status: AC
Start: 2018-07-19 — End: 2018-07-19
  Administered 2018-07-19: 4 mg via INTRAVENOUS
  Filled 2018-07-19: qty 2

## 2018-07-19 MED ORDER — METOCLOPRAMIDE HCL 10 MG PO TABS: 10.0000 mg | ORAL_TABLET | Freq: Three times a day (TID) | ORAL | 1 refills | Status: DC | PRN

## 2018-07-19 MED ORDER — IOHEXOL 300 MG/ML  SOLN
100.0000 mL | Freq: Once | INTRAMUSCULAR | Status: AC | PRN
  Administered 2018-07-19: 100 mL via INTRAVENOUS

## 2018-07-19 MED ORDER — MORPHINE SULFATE (PF) 4 MG/ML IV SOLN
4.0000 mg | Freq: Once | INTRAVENOUS | Status: AC
  Administered 2018-07-19: 4 mg via INTRAVENOUS
  Filled 2018-07-19: qty 1

## 2018-07-19 MED ORDER — ALUM & MAG HYDROXIDE-SIMETH 200-200-20 MG/5ML PO SUSP
30.0000 mL | Freq: Once | ORAL | Status: AC
  Administered 2018-07-19: 30 mL via ORAL
  Filled 2018-07-19: qty 30

## 2018-07-19 NOTE — ED Notes (Signed)
Pt taken to CT.

## 2018-07-19 NOTE — ED Notes (Signed)
MD at bedside. 

## 2018-07-19 NOTE — ED Provider Notes (Signed)
[facility] Emergency Department Provider Note       Time seen: ----------------------------------------- 9:57 AM on 07/19/2018 -----------------------------------------  I have reviewed the triage vital signs and the nursing notes.  HISTORY   Chief Complaint Abdominal Pain; Dizziness; and Chest Pain   HPI Garrett Horton is a 58 y.o. male with a history of GERD, inguinal hernia, kidney stones who presents to the ED for epigastric pain that radiates into his left arm and shoulder with nausea.  He has had some vomiting as well.  He does report some dizziness, denies shortness of breath.  His family had started him on some IV fluids this morning for dehydration.  He denies fevers, chills or other complaints.  Past Medical History:  Diagnosis Date  . GERD (gastroesophageal reflux disease)   . Hernia, inguinal, bilateral   . Kidney stone   . Kidney stone   . Kidney stones     Patient Active Problem List   Diagnosis Date Noted  . Ureteral stone with hydronephrosis 03/02/2015    Past Surgical History:  Procedure Laterality Date  . CYSTOSCOPY W/ RETROGRADES Left 03/02/2015   Procedure: CYSTOSCOPY WITH RETROGRADE PYELOGRAM;  Surgeon: Alexis Frock, MD;  Location: [facility];  Service: Urology;  Laterality: Left;  . CYSTOSCOPY W/ RETROGRADES N/A 03/21/2015   Procedure: CYSTOSCOPY WITH RETROGRADE PYELOGRAM;  Surgeon: Ardis Hughs, MD;  Location: [facility];  Service: Urology;  Laterality: N/A;  . CYSTOSCOPY WITH STENT PLACEMENT Left 03/02/2015   Procedure: CYSTOSCOPY WITH STENT PLACEMENT;  Surgeon: Alexis Frock, MD;  Location: [facility];  Service: Urology;  Laterality: Left;  . CYSTOSCOPY/URETEROSCOPY/HOLMIUM LASER/STENT PLACEMENT Left 03/21/2015   Procedure: CYSTOSCOPY/URETEROSCOPY/STENT EXCHANGE ;  Surgeon: Ardis Hughs, MD;  Location: [facility] OR[facility];  Service: Urology;  Laterality: Left;  . HERNIA REPAIR    . VASECTOMY      Allergies Patient has no  known allergies.  Social History Social History   Tobacco Use  . Smoking status: Never Smoker  . Smokeless tobacco: Current User    Types: Snuff  Substance Use Topics  . Alcohol use: Yes    Alcohol/week: 0.0 standard drinks    Comment: occasional=can go months with no alcohol, then 1-3 drinks in a month..  . Drug use: No   Review of Systems Constitutional: Negative for fever. Cardiovascular: Negative for chest pain. Respiratory: Negative for shortness of breath. Gastrointestinal: Positive for abdominal pain, vomiting and nausea Musculoskeletal: Negative for back pain. Skin: Negative for rash. Neurological: Negative for headaches, focal weakness or numbness.  All systems negative/normal/unremarkable except as stated in the HPI  ____________________________________________   PHYSICAL EXAM:  VITAL SIGNS: ED Triage Vitals  Enc Vitals Group     BP 07/19/18 0912 (!) 144/90     Pulse Rate 07/19/18 0912 71     Resp 07/19/18 0912 16     Temp 07/19/18 0912 97.7 F (36.5 C)     Temp Source 07/19/18 0912 Oral     SpO2 07/19/18 0912 97 %     Weight 07/19/18 0913 195 lb (88.5 kg)     Height 07/19/18 0913 5\' 11"  (1.803 m)     Head Circumference --      Peak Flow --      Pain Score 07/19/18 0924 5     Pain Loc --      Pain Edu? --      Excl. in [place]? --    Constitutional: Alert and oriented. Well appearing and in no distress.  Eyes: Conjunctivae are normal. Normal extraocular movements. ENT      Head: Normocephalic and atraumatic.      Nose: No congestion/rhinnorhea.      Mouth/Throat: Mucous membranes are moist.      Neck: No stridor. Cardiovascular: Normal rate, regular rhythm. No murmurs, rubs, or gallops. Respiratory: Normal respiratory effort without tachypnea nor retractions. Breath sounds are clear and equal bilaterally. No wheezes/rales/rhonchi. Gastrointestinal: Soft and nontender. Normal bowel sounds Musculoskeletal: Nontender with normal range of motion in  extremities. No lower extremity tenderness nor edema. Neurologic:  Normal speech and language. No gross focal neurologic deficits are appreciated.  Skin:  Skin is warm, dry and intact. No rash noted. Psychiatric: Mood and affect are normal. Speech and behavior are normal.  ____________________________________________  EKG: Interpreted by me.  Sinus rhythm the rate of 74 bpm, normal PR interval, normal QRS, normal QT.  Some borderline ST elevation is noted which is unchanged from prior  ____________________________________________  ED COURSE:  As part of my medical decision making, I reviewed the following data within the [place] History obtained from family if available, nursing notes, old chart and ekg, as well as notes from prior ED visits. Patient presented for epigastric pain, we will assess with labs and imaging as indicated at this time.   Procedures ____________________________________________   LABS (pertinent positives/negatives)  Labs Reviewed  CBC - Abnormal; Notable for the following components:      Result Value   WBC 14.7 (*)    All other components within normal limits  COMPREHENSIVE METABOLIC PANEL - Abnormal; Notable for the following components:   Glucose, Bld 117 (*)    Total Bilirubin 1.4 (*)    All other components within normal limits  TROPONIN I  LIPASE, BLOOD    RADIOLOGY Images were viewed by me  Chest x-ray reveals hiatal hernia no other acute process. CT of the abdomen pelvis with contrast  IMPRESSION: 1. No acute abdominal/pelvic findings, mass lesions or adenopathy. 2. Stable hiatal hernia. 3. Stable right lower lobe lobulated low-attenuation lesion, likely benign bronchogenic cyst. 4. Possible new 9 mm right middle lobe sub solid nodule. Recommend follow-up noncontrast chest CT in 3-4 months to reassess. 5. Stable large lower pole left renal cyst. 6. Colonic diverticulosis without findings for acute  diverticulitis. ____________________________________________   DIFFERENTIAL DIAGNOSIS   GERD, peptic ulcer disease, AAA, dissection, hiatal hernia  FINAL ASSESSMENT AND PLAN  Epigastric pain, hiatal hernia   Plan: The patient had presented for epigastric pain. Patient's labs did reveal leukocytosis of uncertain etiology but no other acute findings. Patient's imaging was negative.  He does have a lung nodule which will need reassessment in 3 to 4 months.  I will add Carafate to his omeprazole.  He will be referred to GI for close outpatient follow-up.   Laurence Aly, MD    Note: This note was generated in part or whole with voice recognition software. Voice recognition is usually quite accurate but there are transcription errors that can and very often do occur. I apologize for any typographical errors that were not detected and corrected.     Earleen Newport, MD 07/19/18 1115

## 2018-07-19 NOTE — ED Notes (Signed)
Pt ambulatory to treatment room. No distress noted. Family member with pt.

## 2018-07-19 NOTE — ED Notes (Signed)
Pt returned from CT °

## 2018-07-19 NOTE — ED Triage Notes (Addendum)
Pt c/o sever epigastric pain that radiates into left arm/shoulder with nausea - reports dizziness - denies shortness of breath PT REPORTS THAT HIS WIFE STARTED AN IV ON HIM THIS MORNING TO GIVE FLUIDS FOR DEHYDRATION

## 2018-07-29 ENCOUNTER — Other Ambulatory Visit: Payer: Self-pay

## 2018-07-29 ENCOUNTER — Encounter: Payer: Self-pay | Admitting: Gastroenterology

## 2018-07-29 ENCOUNTER — Ambulatory Visit: Payer: 59 | Admitting: Gastroenterology

## 2018-07-29 VITALS — BP 135/82 | HR 58 | Ht 70.5 in | Wt 193.4 lb

## 2018-07-29 DIAGNOSIS — R079 Chest pain, unspecified: Secondary | ICD-10-CM | POA: Diagnosis not present

## 2018-07-29 DIAGNOSIS — K219 Gastro-esophageal reflux disease without esophagitis: Secondary | ICD-10-CM | POA: Diagnosis not present

## 2018-07-29 MED ORDER — PANTOPRAZOLE SODIUM 40 MG PO TBEC: 40.0000 mg | DELAYED_RELEASE_TABLET | Freq: Every day | ORAL | 6 refills | Status: DC

## 2018-07-29 MED ORDER — NA SULFATE-K SULFATE-MG SULF 17.5-3.13-1.6 GM/177ML PO SOLN: 1.0000 | ORAL | 0 refills | Status: DC

## 2018-07-29 NOTE — Progress Notes (Signed)
Gastroenterology Consultation  Referring Provider:     Dr. Jimmye Norman Primary Care Physician:  Patient, No Pcp Per Primary Gastroenterologist:  Dr. Allen Norris     Reason for Consultation:     Chest pain        HPI:   Garrett Horton is a 59 y.o. y/o male referred for consultation & management of Chest pain by Dr. Patient, No Pcp Per.  This patient recently was in the emergency room with chest pain that he stated he went up to his neck.  The patient reports that he was told that it probably reflux and not a cardiac issue.  The patient had a CT scan of the abdomen that showed a moderate-sized hiatal hernia.  The patient states he has suffered from heartburn for many years.  He had seen semi-many years ago who recommended that the patient undergo an EGD but he did not follow through.  The patient was given a PPI at that time and when he would go through with the EGD or follow-up the prescription would not be refilled by his other gastroenterologist.  The patient then started to take a PPI over-the-counter.  The patient denies any unexplained weight loss fevers chills nausea or vomiting.  The patient also denies ever having a colonoscopy in the past.  There is no report of any family history of colon cancer colon polyps except for his father had some polyps after the age of 16.  The patient's wife reports that the patient was admitted to the emergency room after having daily intake of Brats.  The patient reports that his symptoms are usually much worse when he eats greasy or fatty foods.  There is no report of any melena or pain.  Past Medical History:  Diagnosis Date  . GERD (gastroesophageal reflux disease)   . Hernia, inguinal, bilateral   . Kidney stone   . Kidney stone   . Kidney stones     Past Surgical History:  Procedure Laterality Date  . CYSTOSCOPY W/ RETROGRADES Left 03/02/2015   Procedure: CYSTOSCOPY WITH RETROGRADE PYELOGRAM;  Surgeon: Alexis Frock, MD;  Location: [facility];  Service:  Urology;  Laterality: Left;  . CYSTOSCOPY W/ RETROGRADES N/A 03/21/2015   Procedure: CYSTOSCOPY WITH RETROGRADE PYELOGRAM;  Surgeon: Ardis Hughs, MD;  Location: [facility];  Service: Urology;  Laterality: N/A;  . CYSTOSCOPY WITH STENT PLACEMENT Left 03/02/2015   Procedure: CYSTOSCOPY WITH STENT PLACEMENT;  Surgeon: Alexis Frock, MD;  Location: [facility];  Service: Urology;  Laterality: Left;  . CYSTOSCOPY/URETEROSCOPY/HOLMIUM LASER/STENT PLACEMENT Left 03/21/2015   Procedure: CYSTOSCOPY/URETEROSCOPY/STENT EXCHANGE ;  Surgeon: Ardis Hughs, MD;  Location: [facility];  Service: Urology;  Laterality: Left;  . HERNIA REPAIR    . VASECTOMY      Prior to Admission medications   Medication Sig Start Date End Date Taking? Authorizing Provider  chlorhexidine (PERIDEX) 0.12 % solution  07/09/18   [provider]  ciprofloxacin (CIPRO) 500 MG tablet Take 1 tablet (500 mg total) by mouth once. 03/21/15   Ardis Hughs, MD  ibuprofen (ADVIL,MOTRIN) 200 MG tablet Take 200 mg by mouth every 6 (six) hours as needed.    [provider]  ketorolac (TORADOL) 10 MG tablet Take 1 tablet (10 mg total) by mouth every 8 (eight) hours as needed for moderate pain. 06/25/15   Lisa Roca, MD  metoCLOPramide (REGLAN) 10 MG tablet Take 1 tablet (10 mg total) by mouth every 8 (eight) hours as needed for  nausea or vomiting. 07/19/18   Earleen Newport, MD  omeprazole (PRILOSEC OTC) 20 MG tablet Take 20 mg by mouth daily.    [provider]  ondansetron (ZOFRAN ODT) 4 MG disintegrating tablet Take 1 tablet (4 mg total) by mouth every 8 (eight) hours as needed for nausea or vomiting. Patient not taking: Reported on 03/08/2015 03/01/15   Loney Hering, MD  ondansetron (ZOFRAN) 4 MG tablet Take 1 tablet (4 mg total) by mouth every 8 (eight) hours as needed for nausea or vomiting. 06/25/15   Lisa Roca, MD  oxyCODONE-acetaminophen (ROXICET) 5-325 MG tablet Take 1 tablet by mouth  every 4 (four) hours as needed for severe pain. 06/25/15   Lisa Roca, MD  sucralfate (CARAFATE) 1 g tablet Take 1 tablet (1 g total) by mouth 4 (four) times daily. 07/19/18 07/19/19  Earleen Newport, MD  tamsulosin (FLOMAX) 0.4 MG CAPS capsule Take 1 capsule (0.4 mg total) by mouth daily. 06/25/15   Lisa Roca, MD    Family History  Problem Relation Age of Onset  . Nephrolithiasis Father   . Nephrolithiasis Brother   . Prostate cancer Neg Hx   . Bladder Cancer Neg Hx      Social History   Tobacco Use  . Smoking status: Never Smoker  . Smokeless tobacco: Current User    Types: Snuff  Substance Use Topics  . Alcohol use: Yes    Alcohol/week: 0.0 standard drinks    Comment: occasional=can go months with no alcohol, then 1-3 drinks in a month..  . Drug use: No    Allergies as of 07/29/2018  . (No Known Allergies)    Review of Systems:    All systems reviewed and negative except where noted in HPI.   Physical Exam:  There were no vitals taken for this visit. No LMP for male patient. General:   Alert,  Well-developed, well-nourished, pleasant and cooperative in NAD Head:  Normocephalic and atraumatic. Eyes:  Sclera clear, no icterus.   Conjunctiva pink. Ears:  Normal auditory acuity. Nose:  No deformity, discharge, or lesions. Mouth:  No deformity or lesions,oropharynx pink & moist. Neck:  Supple; no masses or thyromegaly. Lungs:  Respirations even and unlabored.  Clear throughout to auscultation.   No wheezes, crackles, or rhonchi. No acute distress. Heart:  Regular rate and rhythm; no murmurs, clicks, rubs, or gallops. Abdomen:  Normal bowel sounds.  No bruits.  Soft, non-tender and non-distended without masses, hepatosplenomegaly or hernias noted.  No guarding or rebound tenderness.  Negative Carnett sign.   Rectal:  Deferred.  Msk:  Symmetrical without gross deformities.  Good, equal movement & strength bilaterally. Pulses:  Normal pulses noted. Extremities:  No  clubbing or edema.  No cyanosis. Neurologic:  Alert and oriented x3;  grossly normal neurologically. Skin:  Intact without significant lesions or rashes.  No jaundice. Lymph Nodes:  No significant cervical adenopathy. Psych:  Alert and cooperative. Normal mood and affect.  Imaging Studies: Dg Chest 2 View  Result Date: 07/19/2018 CLINICAL DATA:  58 year old male with history of severe epigastric pain radiating into the left arm and shoulder area. Nausea. EXAM: CHEST - 2 VIEW COMPARISON:  No priors. FINDINGS: Lung volumes are low. No consolidative airspace disease. No pleural effusions. No pneumothorax. No pulmonary nodule or mass noted. Moderate to large hiatal hernia. Pulmonary vasculature and the cardiomediastinal silhouette are otherwise within normal limits. IMPRESSION: 1. Low lung volumes without radiographic evidence of acute cardiopulmonary disease. 2. Moderate to large  hiatal hernia. Electronically Signed   By: Vinnie Langton M.D.   On: 07/19/2018 10:09   Ct Abdomen Pelvis W Contrast  Result Date: 07/19/2018 CLINICAL DATA:  Left lower quadrant abdominal pain on and off for months. EXAM: CT ABDOMEN AND PELVIS WITH CONTRAST TECHNIQUE: Multidetector CT imaging of the abdomen and pelvis was performed using the standard protocol following bolus administration of intravenous contrast. CONTRAST:  126mL OMNIPAQUE IOHEXOL 300 MG/ML  SOLN COMPARISON:  CT scan 03/01/2015 FINDINGS: Lower chest: Moderate-sized hiatal hernia with fat and fluid surrounding the distal esophagus. The heart is normal in size. No pericardial effusion. Smoothly marginated and somewhat lobulated lesion is noted near the right inferior pulmonary vein in the right lower lobe. It is low attenuation but measures slightly greater than fluid. It could be a bronchogenic cyst. It is unchanged since the prior study from 2016 and is considered benign. 9 mm semi solid nodule noted in the right middle lobe on image number 5. This was not  covered on the prior CT scan. I would recommend a follow-up noncontrast chest CT in 3-4 months to reassess this. Hepatobiliary: No focal hepatic lesions or intrahepatic biliary dilatation. The gallbladder is normal. No common bile duct dilatation. Pancreas: No mass, inflammation or ductal dilatation. Spleen: Normal size.  No focal lesions. Adrenals/Urinary Tract: The adrenal glands and kidneys are unremarkable. There is a large lower pole cyst which is stable. The bladder is unremarkable. Stomach/Bowel: The stomach, duodenum, small bowel and colon are unremarkable. No acute inflammatory changes, mass lesions or obstructive findings. Scattered colonic diverticulosis without findings for acute diverticulitis. The terminal ileum and appendix are normal. Vascular/Lymphatic: The aorta is normal in caliber. No dissection. The branch vessels are patent. The major venous structures are patent. No mesenteric or retroperitoneal mass or adenopathy. Small scattered lymph nodes are noted. Reproductive: The prostate gland and seminal vesicles are unremarkable. Other: Small inguinal hernias containing fat are noted. Surgical changes from prior right inguinal hernia repair. Musculoskeletal: No significant bony findings. IMPRESSION: 1. No acute abdominal/pelvic findings, mass lesions or adenopathy. 2. Stable hiatal hernia. 3. Stable right lower lobe lobulated low-attenuation lesion, likely benign bronchogenic cyst. 4. Possible new 9 mm right middle lobe sub solid nodule. Recommend follow-up noncontrast chest CT in 3-4 months to reassess. 5. Stable large lower pole left renal cyst. 6. Colonic diverticulosis without findings for acute diverticulitis. Electronically Signed   By: Marijo Sanes M.D.   On: 07/19/2018 11:05    Assessment and Plan:   Garrett Horton is a 58 y.o. y/o male who comes in with a long history of heartburn many years. The patient will be started on Protonix 40 mg daily.  The patient also be set up for a  screening colonoscopy in an EGD to rule out any Barrett's esophagus or peptic ulcer disease.  The patient has been explained the possibilities of the findings of his colonoscopy and has been stressed that he should undergo these procedures at this time.  The patient sas been explained the plan and agrees with it.  Lucilla Lame, MD. Marval Regal    Note: This dictation was prepared with Dragon dictation along with smaller phrase technology. Any transcriptional errors that result from this process are unintentional.

## 2018-07-31 ENCOUNTER — Other Ambulatory Visit: Payer: Self-pay

## 2018-07-31 ENCOUNTER — Encounter: Payer: Self-pay | Admitting: *Deleted

## 2018-07-31 DIAGNOSIS — K219 Gastro-esophageal reflux disease without esophagitis: Secondary | ICD-10-CM

## 2018-07-31 DIAGNOSIS — Z1211 Encounter for screening for malignant neoplasm of colon: Secondary | ICD-10-CM

## 2018-08-04 NOTE — Discharge Instructions (Signed)
General Anesthesia, Adult, Care After  This sheet gives you information about how to care for yourself after your procedure. Your health care provider may also give you more specific instructions. If you have problems or questions, contact your health care provider.  What can I expect after the procedure?  After the procedure, the following side effects are common:  Pain or discomfort at the IV site.  Nausea.  Vomiting.  Sore throat.  Trouble concentrating.  Feeling cold or chills.  Weak or tired.  Sleepiness and fatigue.  Soreness and body aches. These side effects can affect parts of the body that were not involved in surgery.  Follow these instructions at home:    For at least 24 hours after the procedure:  Have a responsible adult stay with you. It is important to have someone help care for you until you are awake and alert.  Rest as needed.  Do not:  Participate in activities in which you could fall or become injured.  Drive.  Use heavy machinery.  Drink alcohol.  Take sleeping pills or medicines that cause drowsiness.  Make important decisions or sign legal documents.  Take care of children on your own.  Eating and drinking  Follow any instructions from your health care provider about eating or drinking restrictions.  When you feel hungry, start by eating small amounts of foods that are soft and easy to digest (bland), such as toast. Gradually return to your regular diet.  Drink enough fluid to keep your urine pale yellow.  If you vomit, rehydrate by drinking water, juice, or clear broth.  General instructions  If you have sleep apnea, surgery and certain medicines can increase your risk for breathing problems. Follow instructions from your health care provider about wearing your sleep device:  Anytime you are sleeping, including during daytime naps.  While taking prescription pain medicines, sleeping medicines, or medicines that make you drowsy.  Return to your normal activities as told by your health care  provider. Ask your health care provider what activities are safe for you.  Take over-the-counter and prescription medicines only as told by your health care provider.  If you smoke, do not smoke without supervision.  Keep all follow-up visits as told by your health care provider. This is important.  Contact a health care provider if:  You have nausea or vomiting that does not get better with medicine.  You cannot eat or drink without vomiting.  You have pain that does not get better with medicine.  You are unable to pass urine.  You develop a skin rash.  You have a fever.  You have redness around your IV site that gets worse.  Get help right away if:  You have difficulty breathing.  You have chest pain.  You have blood in your urine or stool, or you vomit blood.  Summary  After the procedure, it is common to have a sore throat or nausea. It is also common to feel tired.  Have a responsible adult stay with you for the first 24 hours after general anesthesia. It is important to have someone help care for you until you are awake and alert.  When you feel hungry, start by eating small amounts of foods that are soft and easy to digest (bland), such as toast. Gradually return to your regular diet.  Drink enough fluid to keep your urine pale yellow.  Return to your normal activities as told by your health care provider. Ask your health care   provider what activities are safe for you.  This information is not intended to replace advice given to you by your health care provider. Make sure you discuss any questions you have with your health care provider.  Document Released: 09/02/2000 Document Revised: 01/10/2017 Document Reviewed: 01/10/2017  Elsevier Interactive Patient Education  2019 [employer]

## 2018-08-06 ENCOUNTER — Telehealth: Payer: Self-pay | Admitting: Gastroenterology

## 2018-08-06 NOTE — Telephone Encounter (Signed)
Spoke with pt regarding his bowel prep. Rx was sent to [facility] on 07/29/18. Contacted pharmacy to verify rx was ready.

## 2018-08-06 NOTE — Telephone Encounter (Signed)
Patient needs his prep for colonoscopy with Dr Allen Norris  For tomorrow. He would like it called in to [facility].

## 2018-08-07 ENCOUNTER — Encounter
Admission: RE | Disposition: A | Discharge status: Home / Self Care | Payer: Self-pay | Source: Home / Self Care | Attending: Gastroenterology

## 2018-08-07 ENCOUNTER — Ambulatory Visit: Payer: 59 | Admitting: Anesthesiology

## 2018-08-07 ENCOUNTER — Ambulatory Visit
Admission: RE | Admit: 2018-08-07 | Discharge: 2018-08-07 | Disposition: A | Discharge status: Home / Self Care | Payer: 59 | Attending: Gastroenterology | Admitting: Gastroenterology

## 2018-08-07 DIAGNOSIS — Z87891 Personal history of nicotine dependence: Secondary | ICD-10-CM | POA: Diagnosis not present

## 2018-08-07 DIAGNOSIS — D122 Benign neoplasm of ascending colon: Secondary | ICD-10-CM

## 2018-08-07 DIAGNOSIS — D123 Benign neoplasm of transverse colon: Secondary | ICD-10-CM | POA: Diagnosis not present

## 2018-08-07 DIAGNOSIS — Z1211 Encounter for screening for malignant neoplasm of colon: Secondary | ICD-10-CM | POA: Insufficient documentation

## 2018-08-07 DIAGNOSIS — D124 Benign neoplasm of descending colon: Secondary | ICD-10-CM | POA: Diagnosis not present

## 2018-08-07 DIAGNOSIS — Z79899 Other long term (current) drug therapy: Secondary | ICD-10-CM | POA: Insufficient documentation

## 2018-08-07 DIAGNOSIS — K219 Gastro-esophageal reflux disease without esophagitis: Secondary | ICD-10-CM | POA: Diagnosis present

## 2018-08-07 DIAGNOSIS — K640 First degree hemorrhoids: Secondary | ICD-10-CM | POA: Insufficient documentation

## 2018-08-07 DIAGNOSIS — K449 Diaphragmatic hernia without obstruction or gangrene: Secondary | ICD-10-CM | POA: Insufficient documentation

## 2018-08-07 HISTORY — PX: ESOPHAGOGASTRODUODENOSCOPY (EGD) WITH PROPOFOL: SHX5813

## 2018-08-07 HISTORY — PX: COLONOSCOPY WITH PROPOFOL: SHX5780

## 2018-08-07 HISTORY — PX: POLYPECTOMY: SHX5525

## 2018-08-07 SURGERY — COLONOSCOPY WITH PROPOFOL
Anesthesia: General | Site: Throat

## 2018-08-07 MED ORDER — LACTATED RINGERS IV SOLN
INTRAVENOUS | Status: DC | PRN
  Administered 2018-08-07: 11:00:00 via INTRAVENOUS

## 2018-08-07 MED ORDER — STERILE WATER FOR IRRIGATION IR SOLN
Status: DC | PRN
  Administered 2018-08-07 (×2)

## 2018-08-07 MED ORDER — LACTATED RINGERS IV SOLN: INTRAVENOUS | Status: DC

## 2018-08-07 MED ORDER — SODIUM CHLORIDE 0.9 % IV SOLN: INTRAVENOUS | Status: DC

## 2018-08-07 MED ORDER — PROPOFOL 10 MG/ML IV BOLUS
INTRAVENOUS | Status: DC | PRN
  Administered 2018-08-07: 20 mg via INTRAVENOUS
  Administered 2018-08-07: 50 mg via INTRAVENOUS
  Administered 2018-08-07: 20 mg via INTRAVENOUS
  Administered 2018-08-07: 30 mg via INTRAVENOUS
  Administered 2018-08-07: 50 mg via INTRAVENOUS
  Administered 2018-08-07: 100 mg via INTRAVENOUS
  Administered 2018-08-07: 20 mg via INTRAVENOUS
  Administered 2018-08-07 (×2): 30 mg via INTRAVENOUS

## 2018-08-07 MED ORDER — LIDOCAINE HCL (CARDIAC) PF 100 MG/5ML IV SOSY
PREFILLED_SYRINGE | INTRAVENOUS | Status: DC | PRN
  Administered 2018-08-07: 40 mg via INTRAVENOUS

## 2018-08-07 MED ORDER — GLYCOPYRROLATE 0.2 MG/ML IJ SOLN
INTRAMUSCULAR | Status: DC | PRN
  Administered 2018-08-07: 0.2 mg via INTRAVENOUS

## 2018-08-07 SURGICAL SUPPLY — 8 items
BLOCK BITE 60FR ADLT L/F GRN (MISCELLANEOUS) ×4 IMPLANT
CANISTER SUCT 1200ML W/VALVE (MISCELLANEOUS) ×4 IMPLANT
GOWN CVR UNV OPN BCK APRN NK (MISCELLANEOUS) ×4 IMPLANT
GOWN ISOL THUMB LOOP REG UNIV (MISCELLANEOUS) ×4
KIT ENDO PROCEDURE OLY (KITS) ×4 IMPLANT
SNARE LASSO HEX 3 IN 1 (INSTRUMENTS) ×4 IMPLANT
TRAP ETRAP POLY (MISCELLANEOUS) ×4 IMPLANT
WATER STERILE IRR 250ML POUR (IV SOLUTION) ×4 IMPLANT

## 2018-08-07 NOTE — Anesthesia Postprocedure Evaluation (Signed)
Anesthesia Post Note  Patient: Garrett Horton  Procedure(s) Performed: COLONOSCOPY WITH BIOPSIES (N/A Rectum) ESOPHAGOGASTRODUODENOSCOPY (EGD) WITH PROPOFOL (N/A Throat) POLYPECTOMY (N/A Rectum)  Patient location during evaluation: PACU Anesthesia Type: General Level of consciousness: awake and alert Pain management: pain level controlled Vital Signs Assessment: post-procedure vital signs reviewed and stable Respiratory status: spontaneous breathing, nonlabored ventilation, respiratory function stable and patient connected to nasal cannula oxygen Cardiovascular status: blood pressure returned to baseline and stable Postop Assessment: no apparent nausea or vomiting Anesthetic complications: no    Deiontae Rabel C

## 2018-08-07 NOTE — H&P (Signed)
Lucilla Lame, MD [address] [place], [place] [address] Phone:[phone] Fax : [phone]  Primary Care Physician:  Patient, No Pcp Per Primary Gastroenterologist:  Dr. Allen Norris  Pre-Procedure History & Physical: HPI:  Garrett Horton is a 58 y.o. male is here for an endoscopy and colonoscopy.   Past Medical History:  Diagnosis Date  . GERD (gastroesophageal reflux disease)   . Hernia, inguinal, bilateral   . Kidney stones     Past Surgical History:  Procedure Laterality Date  . CYSTOSCOPY W/ RETROGRADES Left 03/02/2015   Procedure: CYSTOSCOPY WITH RETROGRADE PYELOGRAM;  Surgeon: Alexis Frock, MD;  Location: [facility];  Service: Urology;  Laterality: Left;  . CYSTOSCOPY W/ RETROGRADES N/A 03/21/2015   Procedure: CYSTOSCOPY WITH RETROGRADE PYELOGRAM;  Surgeon: Ardis Hughs, MD;  Location: [facility];  Service: Urology;  Laterality: N/A;  . CYSTOSCOPY WITH STENT PLACEMENT Left 03/02/2015   Procedure: CYSTOSCOPY WITH STENT PLACEMENT;  Surgeon: Alexis Frock, MD;  Location: [facility];  Service: Urology;  Laterality: Left;  . CYSTOSCOPY/URETEROSCOPY/HOLMIUM LASER/STENT PLACEMENT Left 03/21/2015   Procedure: CYSTOSCOPY/URETEROSCOPY/STENT EXCHANGE ;  Surgeon: Ardis Hughs, MD;  Location: [facility];  Service: Urology;  Laterality: Left;  . HERNIA REPAIR    . VASECTOMY      Prior to Admission medications   Medication Sig Start Date End Date Taking? Authorizing Provider  chlorhexidine (PERIDEX) 0.12 % solution  07/09/18  Yes [provider]  omeprazole (PRILOSEC OTC) 20 MG tablet Take 20 mg by mouth daily.   Yes [provider]  pantoprazole (PROTONIX) 40 MG tablet Take 1 tablet (40 mg total) by mouth daily. 07/29/18  Yes Lucilla Lame, MD  sucralfate (CARAFATE) 1 g tablet Take 1 tablet (1 g total) by mouth 4 (four) times daily. 07/19/18 07/19/19 Yes Earleen Newport, MD  metoCLOPramide (REGLAN) 10 MG tablet Take 1 tablet (10 mg total) by mouth  every 8 (eight) hours as needed for nausea or vomiting. Patient not taking: Reported on 07/29/2018 07/19/18   Earleen Newport, MD  Na Sulfate-K Sulfate-Mg Sulf (SUPREP BOWEL PREP KIT) 17.5-3.13-1.6 GM/177ML SOLN Take 1 kit by mouth as directed. 07/29/18   Lucilla Lame, MD    Allergies as of 07/31/2018  . (No Known Allergies)    Family History  Problem Relation Age of Onset  . Nephrolithiasis Father   . Nephrolithiasis Brother   . Prostate cancer Neg Hx   . Bladder Cancer Neg Hx     Social History   Socioeconomic History  . Marital status: Married    Spouse name: Not on file  . Number of children: Not on file  . Years of education: Not on file  . Highest education level: Not on file  Occupational History  . Not on file  Social Needs  . Financial resource strain: Not on file  . Food insecurity:    Worry: Not on file    Inability: Not on file  . Transportation needs:    Medical: Not on file    Non-medical: Not on file  Tobacco Use  . Smoking status: Former Smoker    Last attempt to quit: 1999    Years since quitting: 21.1  . Smokeless tobacco: Current User    Types: Snuff  Substance and Sexual Activity  . Alcohol use: Yes    Alcohol/week: 0.0 standard drinks    Comment: occasional=can go months with no alcohol, then 1-3 drinks in a month..  . Drug use: No  .  Sexual activity: Not on file  Lifestyle  . Physical activity:    Days per week: Not on file    Minutes per session: Not on file  . Stress: Not on file  Relationships  . Social connections:    Talks on phone: Not on file    Gets together: Not on file    Attends religious service: Not on file    Active member of club or organization: Not on file    Attends meetings of clubs or organizations: Not on file    Relationship status: Not on file  . Intimate partner violence:    Fear of current or ex partner: Not on file    Emotionally abused: Not on file    Physically abused: Not on file    Forced sexual  activity: Not on file  Other Topics Concern  . Not on file  Social History Narrative  . Not on file    Review of Systems: See HPI, otherwise negative ROS  Physical Exam: Ht 5' 10.5" (1.791 m)   Wt 86.6 kg   BMI 27.02 kg/m  General:   Alert,  pleasant and cooperative in NAD Head:  Normocephalic and atraumatic. Neck:  Supple; no masses or thyromegaly. Lungs:  Clear throughout to auscultation.    Heart:  Regular rate and rhythm. Abdomen:  Soft, nontender and nondistended. Normal bowel sounds, without guarding, and without rebound.   Neurologic:  Alert and  oriented x4;  grossly normal neurologically.  Impression/Plan: Garrett Horton is here for an endoscopy and colonoscopy to be performed for GERD and screening  Risks, benefits, limitations, and alternatives regarding  endoscopy and colonoscopy have been reviewed with the patient.  Questions have been answered.  All parties agreeable.   Lucilla Lame, MD  08/07/2018, 10:08 AM

## 2018-08-07 NOTE — Op Note (Signed)
[facility] Gastroenterology Patient Name: Garrett Horton Procedure Date: 08/07/2018 10:41 AM MRN: [id number] Account #: [id number] Date of Birth: Jul 16, 1960 Admit Type: Outpatient Age: 58 Room: [facility] OR ROOM 01 Gender: Male Note Status: Finalized Procedure:            Colonoscopy Indications:          Screening for colorectal malignant neoplasm Providers:            Lucilla Lame MD, MD Medicines:            Propofol per Anesthesia Complications:        No immediate complications. Procedure:            Pre-Anesthesia Assessment:                       - Prior to the procedure, a History and Physical was                        performed, and patient medications and allergies were                        reviewed. The patient's tolerance of previous                        anesthesia was also reviewed. The risks and benefits of                        the procedure and the sedation options and risks were                        discussed with the patient. All questions were                        answered, and informed consent was obtained. Prior                        Anticoagulants: The patient has taken no previous                        anticoagulant or antiplatelet agents. ASA Grade                        Assessment: II - A patient with mild systemic disease.                        After reviewing the risks and benefits, the patient was                        deemed in satisfactory condition to undergo the                        procedure.                       After obtaining informed consent, the colonoscope was                        passed under direct vision. Throughout the procedure,                        the patient's blood pressure,  pulse, and oxygen                        saturations were monitored continuously. The was                        introduced through the anus and advanced to the the                        terminal ileum. The colonoscopy was performed  without                        difficulty. The patient tolerated the procedure well.                        The quality of the bowel preparation was excellent. Findings:      The perianal and digital rectal examinations were normal.      Four semi-pedunculated polyps were found in the ascending colon. The       polyps were 3 to 6 mm in size. These polyps were removed with a cold       snare. Resection and retrieval were complete.      Three sessile polyps were found in the transverse colon. The polyps were       4 to 5 mm in size. These polyps were removed with a cold snare.       Resection and retrieval were complete.      A 5 mm polyp was found in the descending colon. The polyp was sessile.       The polyp was removed with a cold snare. Resection and retrieval were       complete.      Non-bleeding internal hemorrhoids were found during retroflexion. The       hemorrhoids were Grade I (internal hemorrhoids that do not prolapse). Impression:           - Four 3 to 6 mm polyps in the ascending colon, removed                        with a cold snare. Resected and retrieved.                       - Three 4 to 5 mm polyps in the transverse colon,                        removed with a cold snare. Resected and retrieved.                       - One 5 mm polyp in the descending colon, removed with                        a cold snare. Resected and retrieved.                       - Non-bleeding internal hemorrhoids. Recommendation:       - Discharge patient to home.                       - Resume previous diet.                       -  Continue present medications.                       - Await pathology results.                       - Repeat colonoscopy in 5 years if polyp adenoma and 10                        years if hyperplastic Procedure Code(s):    --- Professional ---                       [phone], Colonoscopy, flexible; with removal of tumor(s),                        polyp(s), or other  lesion(s) by snare technique Diagnosis Code(s):    --- Professional ---                       Z12.11, Encounter for screening for malignant neoplasm                        of colon                       D12.3, Benign neoplasm of transverse colon (hepatic                        flexure or splenic flexure)                       D12.2, Benign neoplasm of ascending colon                       D12.4, Benign neoplasm of descending colon CPT copyright 2018 American Medical Association. All rights reserved. The codes documented in this report are preliminary and upon coder review may  be revised to meet current compliance requirements. Lucilla Lame MD, MD 08/07/2018 11:15:59 AM This report has been signed electronically. Number of Addenda: 0 Note Initiated On: 08/07/2018 10:41 AM Scope Withdrawal Time: 0 hours 10 minutes 57 seconds  Total Procedure Duration: 0 hours 12 minutes 14 seconds       [facility]

## 2018-08-07 NOTE — Anesthesia Procedure Notes (Signed)
Procedure Name: MAC Date/Time: 08/07/2018 10:45 AM Performed by: Janna Arch, CRNA Pre-anesthesia Checklist: Patient identified, Emergency Drugs available, Suction available, Timeout performed and Patient being monitored Patient Re-evaluated:Patient Re-evaluated prior to induction Oxygen Delivery Method: Nasal cannula Placement Confirmation: positive ETCO2

## 2018-08-07 NOTE — Op Note (Signed)
[facility] Gastroenterology Patient Name: Garrett Horton Procedure Date: 08/07/2018 10:41 AM MRN: [id number] Account #: [id number] Date of Birth: [birth year] Admit Type: Outpatient Age: 58 Room: [facility] OR ROOM 01 Gender: Male Note Status: Finalized Procedure:            Upper GI endoscopy Indications:          Heartburn Providers:            Lucilla Lame MD, MD Medicines:            Propofol per Anesthesia Complications:        No immediate complications. Procedure:            Pre-Anesthesia Assessment:                       - Prior to the procedure, a History and Physical was                        performed, and patient medications and allergies were                        reviewed. The patient's tolerance of previous                        anesthesia was also reviewed. The risks and benefits of                        the procedure and the sedation options and risks were                        discussed with the patient. All questions were                        answered, and informed consent was obtained. Prior                        Anticoagulants: The patient has taken no previous                        anticoagulant or antiplatelet agents. ASA Grade                        Assessment: II - A patient with mild systemic disease.                        After reviewing the risks and benefits, the patient was                        deemed in satisfactory condition to undergo the                        procedure.                       After obtaining informed consent, the endoscope was                        passed under direct vision. Throughout the procedure,                        the patient's blood pressure, pulse, and  oxygen                        saturations were monitored continuously. The was                        introduced through the mouth, and advanced to the                        second part of duodenum. The upper GI endoscopy was   accomplished without difficulty. The patient tolerated                        the procedure well. Findings:      A large hiatal hernia was present.      The entire examined stomach was normal.      The examined duodenum was normal.      The patient had a J-shaped stomach. Impression:           - Large hiatal hernia.                       - Normal stomach.                       - Normal examined duodenum.                       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                       - No specimens collected. Recommendation:       - Discharge patient to home.                       - Resume previous diet.                       - Continue present medications.                       - Perform a colonoscopy today.                       - Do an upper GI series. Procedure Code(s):    --- Professional ---                       [phone], Esophagogastroduodenoscopy, flexible, transoral;                        diagnostic, including collection of specimen(s) by                        brushing or washing, when performed (separate procedure) Diagnosis Code(s):    --- Professional ---                       R12, Heartburn CPT copyright 2018 American Medical Association. All rights reserved. The codes documented in this report are preliminary and upon coder review may  be revised to meet current compliance requirements. Lucilla Lame MD, MD 08/07/2018 10:59:49 AM This report has been signed electronically. Number of Addenda: 0 Note Initiated On: 08/07/2018 10:41 AM Total Procedure Duration: 0 hours 4 minutes 45 seconds  [facility]

## 2018-08-07 NOTE — Transfer of Care (Signed)
Immediate Anesthesia Transfer of Care Note  Patient: Garrett Horton  Procedure(s) Performed: COLONOSCOPY WITH BIOPSIES (N/A Rectum) ESOPHAGOGASTRODUODENOSCOPY (EGD) WITH PROPOFOL (N/A Throat) POLYPECTOMY (N/A Rectum)  Patient Location: PACU  Anesthesia Type: General  Level of Consciousness: awake, alert  and patient cooperative  Airway and Oxygen Therapy: Patient Spontanous Breathing and Patient connected to supplemental oxygen  Post-op Assessment: Post-op Vital signs reviewed, Patient's Cardiovascular Status Stable, Respiratory Function Stable, Patent Airway and No signs of Nausea or vomiting  Post-op Vital Signs: Reviewed and stable  Complications: No apparent anesthesia complications

## 2018-08-07 NOTE — Anesthesia Preprocedure Evaluation (Signed)
Anesthesia Evaluation  Patient identified by MRN, date of birth, ID band Patient awake    Reviewed: Allergy & Precautions, NPO status , Patient's Chart, lab work & pertinent test results  Airway Mallampati: II  TM Distance: >3 FB Neck ROM: Full    Dental no notable dental hx.    Pulmonary neg pulmonary ROS, former smoker,    Pulmonary exam normal breath sounds clear to auscultation       Cardiovascular negative cardio ROS Normal cardiovascular exam Rhythm:Regular Rate:Normal     Neuro/Psych negative neurological ROS  negative psych ROS   GI/Hepatic Neg liver ROS, GERD  Controlled,  Endo/Other  negative endocrine ROS  Renal/GU Renal diseaseKidney stones  negative genitourinary   Musculoskeletal negative musculoskeletal ROS (+)   Abdominal   Peds negative pediatric ROS (+)  Hematology negative hematology ROS (+)   Anesthesia Other Findings   Reproductive/Obstetrics negative OB ROS                             Anesthesia Physical Anesthesia Plan  ASA: II  Anesthesia Plan: General   Post-op Pain Management:    Induction: Intravenous  PONV Risk Score and Plan:   Airway Management Planned: Natural Airway  Additional Equipment:   Intra-op Plan:   Post-operative Plan: Extubation in OR  Informed Consent: I have reviewed the patients History and Physical, chart, labs and discussed the procedure including the risks, benefits and alternatives for the proposed anesthesia with the patient or authorized representative who has indicated his/her understanding and acceptance.     Dental advisory given  Plan Discussed with: CRNA  Anesthesia Plan Comments:         Anesthesia Quick Evaluation

## 2018-08-10 ENCOUNTER — Telehealth: Payer: Self-pay | Admitting: Gastroenterology

## 2018-08-10 ENCOUNTER — Encounter: Payer: Self-pay | Admitting: Gastroenterology

## 2018-08-10 NOTE — Telephone Encounter (Signed)
Left pt's wife a voicemail to advise her the Pantoprazole was sent to [facility] on 07/29/18. I contacted the pharmacy and they stated it was put back in stock for not picking it up. They will refill it today.

## 2018-08-10 NOTE — Telephone Encounter (Signed)
Kim patient's wife called & states he was to have had Protonix 40 mg called in [facility] in [place] from his procedure on 08-07-2018. Please call in meds & call her to confirm it was called in.

## 2018-08-11 ENCOUNTER — Encounter: Payer: Self-pay | Admitting: Gastroenterology

## 2018-08-17 ENCOUNTER — Other Ambulatory Visit: Payer: Self-pay

## 2018-08-17 DIAGNOSIS — K219 Gastro-esophageal reflux disease without esophagitis: Secondary | ICD-10-CM

## 2018-08-20 ENCOUNTER — Other Ambulatory Visit: Payer: Self-pay | Admitting: Gastroenterology

## 2018-08-20 ENCOUNTER — Ambulatory Visit
Admission: RE | Admit: 2018-08-20 | Discharge: 2018-08-20 | Disposition: A | Discharge status: Home / Self Care | Payer: 59 | Source: Ambulatory Visit | Attending: Gastroenterology | Admitting: Gastroenterology

## 2018-08-20 ENCOUNTER — Other Ambulatory Visit: Payer: Self-pay

## 2018-08-20 DIAGNOSIS — K219 Gastro-esophageal reflux disease without esophagitis: Secondary | ICD-10-CM

## 2018-08-21 ENCOUNTER — Other Ambulatory Visit: Payer: Self-pay

## 2018-08-21 ENCOUNTER — Telehealth: Payer: Self-pay

## 2018-08-21 DIAGNOSIS — K449 Diaphragmatic hernia without obstruction or gangrene: Secondary | ICD-10-CM

## 2018-08-21 NOTE — Telephone Encounter (Signed)
Pt notified of barium swallow results. Referral sent to [facility].

## 2018-08-21 NOTE — Telephone Encounter (Signed)
-----   Message from Lucilla Lame, MD sent at 08/20/2018  2:28 PM EDT ----- Let the patient know that he has a large paraesophageal hernia that shows much of the body of the stomach to be in the chest.  This results in a moderate amount of reflux.  This has a risk of getting twisted and causing significant problems if it did.  He should be sent to surgery for evaluation and possible fixing of his paraesophageal hernia.

## 2018-08-26 ENCOUNTER — Ambulatory Visit (INDEPENDENT_AMBULATORY_CARE_PROVIDER_SITE_OTHER): Payer: 59 | Admitting: Surgery

## 2018-08-26 ENCOUNTER — Encounter: Payer: Self-pay | Admitting: Surgery

## 2018-08-26 ENCOUNTER — Other Ambulatory Visit: Payer: Self-pay

## 2018-08-26 VITALS — BP 132/84 | HR 65 | Temp 97.2°F | Ht 70.5 in | Wt 192.8 lb

## 2018-08-26 DIAGNOSIS — K449 Diaphragmatic hernia without obstruction or gangrene: Secondary | ICD-10-CM | POA: Diagnosis not present

## 2018-08-26 NOTE — Patient Instructions (Addendum)
Patient will need to return to the office as needed, please eat easy to digest meals.    Call the office with any questions or concerns.    Hiatal Hernia  A hiatal hernia occurs when part of the stomach slides above the muscle that separates the abdomen from the chest (diaphragm). A person can be born with a hiatal hernia (congenital), or it may develop over time. In almost all cases of hiatal hernia, only the top part of the stomach pushes through the diaphragm. Many people have a hiatal hernia with no symptoms. The larger the hernia, the more likely it is that you will have symptoms. In some cases, a hiatal hernia allows stomach acid to flow back into the tube that carries food from your mouth to your stomach (esophagus). This may cause heartburn symptoms. Severe heartburn symptoms may mean that you have developed a condition called gastroesophageal reflux disease (GERD). What are the causes? This condition is caused by a weakness in the opening (hiatus) where the esophagus passes through the diaphragm to attach to the upper part of the stomach. A person may be born with a weakness in the hiatus, or a weakness can develop over time. What increases the risk? This condition is more likely to develop in:  Older people. Age is a major risk factor for a hiatal hernia, especially if you are over the age of 18.  Pregnant women.  People who are overweight.  People who have frequent constipation. What are the signs or symptoms? Symptoms of this condition usually develop in the form of GERD symptoms. Symptoms include:  Heartburn.  Belching.  Indigestion.  Trouble swallowing.  Coughing or wheezing.  Sore throat.  Hoarseness.  Chest pain.  Nausea and vomiting. How is this diagnosed? This condition may be diagnosed during testing for GERD. Tests that may be done include:  X-rays of your stomach or chest.  An upper gastrointestinal (GI) series. This is an X-ray exam of your GI  tract that is taken after you swallow a chalky liquid that shows up clearly on the X-ray.  Endoscopy. This is a procedure to look into your stomach using a thin, flexible tube that has a tiny camera and light on the end of it. How is this treated? This condition may be treated by:  Dietary and lifestyle changes to help reduce GERD symptoms.  Medicines. These may include: ? Over-the-counter antacids. ? Medicines that make your stomach empty more quickly. ? Medicines that block the production of stomach acid (H2 blockers). ? Stronger medicines to reduce stomach acid (proton pump inhibitors).  Surgery to repair the hernia, if other treatments are not helping. If you have no symptoms, you may not need treatment. Follow these instructions at home: Lifestyle and activity  Do not use any products that contain nicotine or tobacco, such as cigarettes and e-cigarettes. If you need help quitting, ask your health care provider.  Try to achieve and maintain a healthy body weight.  Avoid putting pressure on your abdomen. Anything that puts pressure on your abdomen increases the amount of acid that may be pushed up into your esophagus. ? Avoid bending over, especially after eating. ? Raise the head of your bed by putting blocks under the legs. This keeps your head and esophagus higher than your stomach. ? Do not wear tight clothing around your chest or stomach. ? Try not to strain when having a bowel movement, when urinating, or when lifting heavy objects. Eating and drinking  Avoid foods  that can worsen GERD symptoms. These may include: ? Fatty foods, like fried foods. ? Citrus fruits, like oranges or lemon. ? Other foods and drinks that contain acid, like orange juice or tomatoes. ? Spicy food. ? Chocolate.  Eat frequent small meals instead of three large meals a day. This helps prevent your stomach from getting too full. ? Eat slowly. ? Do not lie down right after eating. ? Do not eat 1-2  hours before bed.  Do not drink beverages with caffeine. These include cola, coffee, cocoa, and tea.  Do not drink alcohol. General instructions  Take over-the-counter and prescription medicines only as told by your health care provider.  Keep all follow-up visits as told by your health care provider. This is important. Contact a health care provider if:  Your symptoms are not controlled with medicines or lifestyle changes.  You are having trouble swallowing.  You have coughing or wheezing that will not go away. Get help right away if:  Your pain is getting worse.  Your pain spreads to your arms, neck, jaw, teeth, or back.  You have shortness of breath.  You sweat for no reason.  You feel sick to your stomach (nauseous) or you vomit.  You vomit blood.  You have bright red blood in your stools.  You have black, tarry stools. This information is not intended to replace advice given to you by your health care provider. Make sure you discuss any questions you have with your health care provider. Document Released: 08/17/2003 Document Revised: 12/30/2016 Document Reviewed: 12/30/2016 Elsevier Interactive Patient Education  2019 [employer].

## 2018-08-31 ENCOUNTER — Encounter: Payer: Self-pay | Admitting: Surgery

## 2018-08-31 NOTE — Progress Notes (Signed)
Patient ID: Garrett Horton, male   DOB: 12-18-1960, 58 y.o.   MRN: [id number]  HPI Garrett Horton is a 58 y.o. male seen in consultation  At the request of Dr. Allen Norris for symptomatic paraesophageal hernia. He reports he has intermittent chest discomfort, reflux and pressure sensation. Worsening after large meals. No specific alleviating factors. Recently went to the ER for epigastric pain that was burning and moderate in nature. Now his symptoms are much better but he still persist w pressure after heavy meals. Recent he underwent EGD by Dr. Allen Norris, I have pers. Reviewed the images showing a large Hiatal hernia. Ct and barium swallow  also pers. Reviewed showing a paraesophageal hernia w reflux most the the body stomach within the mediastinum. No definitive incarceration/strnagulation. He is able to perform more than 6 METS w/o SOB or C/p. CBC showing wbc 14k, and resto of labs including CMP were nml. No previous abdominal operations  HPI  Past Medical History:  Diagnosis Date  . GERD (gastroesophageal reflux disease)   . Hernia, inguinal, bilateral   . Kidney stones     Past Surgical History:  Procedure Laterality Date  . COLONOSCOPY WITH PROPOFOL N/A 08/07/2018   Procedure: COLONOSCOPY WITH BIOPSIES;  Surgeon: Lucilla Lame, MD;  Location: [facility];  Service: Endoscopy;  Laterality: N/A;  . CYSTOSCOPY W/ RETROGRADES Left 03/02/2015   Procedure: CYSTOSCOPY WITH RETROGRADE PYELOGRAM;  Surgeon: Alexis Frock, MD;  Location: [facility];  Service: Urology;  Laterality: Left;  . CYSTOSCOPY W/ RETROGRADES N/A 03/21/2015   Procedure: CYSTOSCOPY WITH RETROGRADE PYELOGRAM;  Surgeon: Ardis Hughs, MD;  Location: [facility];  Service: Urology;  Laterality: N/A;  . CYSTOSCOPY WITH STENT PLACEMENT Left 03/02/2015   Procedure: CYSTOSCOPY WITH STENT PLACEMENT;  Surgeon: Alexis Frock, MD;  Location: [facility];  Service: Urology;  Laterality: Left;  . CYSTOSCOPY/URETEROSCOPY/HOLMIUM LASER/STENT PLACEMENT  Left 03/21/2015   Procedure: CYSTOSCOPY/URETEROSCOPY/STENT EXCHANGE ;  Surgeon: Ardis Hughs, MD;  Location: [facility];  Service: Urology;  Laterality: Left;  . ESOPHAGOGASTRODUODENOSCOPY (EGD) WITH PROPOFOL N/A 08/07/2018   Procedure: ESOPHAGOGASTRODUODENOSCOPY (EGD) WITH PROPOFOL;  Surgeon: Lucilla Lame, MD;  Location: [facility];  Service: Endoscopy;  Laterality: N/A;  . HERNIA REPAIR    . POLYPECTOMY N/A 08/07/2018   Procedure: POLYPECTOMY;  Surgeon: Lucilla Lame, MD;  Location: [facility];  Service: Endoscopy;  Laterality: N/A;  . VASECTOMY      Family History  Problem Relation Age of Onset  . Nephrolithiasis Father   . Nephrolithiasis Brother   . Prostate cancer Neg Hx   . Bladder Cancer Neg Hx     Social History Social History   Tobacco Use  . Smoking status: Former Smoker    Last attempt to quit: 1999    Years since quitting: 21.2  . Smokeless tobacco: Current User    Types: Snuff  Substance Use Topics  . Alcohol use: Yes    Alcohol/week: 0.0 standard drinks    Comment: occasional=can go months with no alcohol, then 1-3 drinks in a month..  . Drug use: No    No Known Allergies  Current Outpatient Medications  Medication Sig Dispense Refill  . chlorhexidine (PERIDEX) 0.12 % solution     . metoCLOPramide (REGLAN) 10 MG tablet Take 1 tablet (10 mg total) by mouth every 8 (eight) hours as needed for nausea or vomiting. 30 tablet 1  . omeprazole (PRILOSEC OTC) 20 MG tablet Take 20 mg by mouth daily.    . pantoprazole (  PROTONIX) 40 MG tablet Take 1 tablet (40 mg total) by mouth daily. 30 tablet 6  . sucralfate (CARAFATE) 1 g tablet Take 1 tablet (1 g total) by mouth 4 (four) times daily. 120 tablet 1   No current facility-administered medications for this visit.      Review of Systems Full ROS  was asked and was negative except for the information on the HPI  Physical Exam Blood pressure 132/84, pulse 65, temperature (!) 97.2 F (36.2 C),  temperature source Temporal, height 5' 10.5" (1.791 m), weight 192 lb 12.8 oz (87.5 kg), SpO2 95 %. CONSTITUTIONAL: NAD EYES: Pupils are equal, round, and reactive to light, Sclera are non-icteric. EARS, NOSE, MOUTH AND THROAT: The oropharynx is clear. The oral mucosa is pink and moist. Hearing is intact to voice. LYMPH NODES:  Lymph nodes in the neck are normal. RESPIRATORY:  Lungs are clear. There is normal respiratory effort, with equal breath sounds bilaterally, and without pathologic use of accessory muscles. CARDIOVASCULAR: Heart is regular without murmurs, gallops, or rubs. GI: The abdomen is soft, nontender, and nondistended. There are no palpable masses. There is no hepatosplenomegaly. There are normal bowel sounds in all quadrants. GU: Rectal deferred.   MUSCULOSKELETAL: Normal muscle strength and tone. No cyanosis or edema.   SKIN: Turgor is good and there are no pathologic skin lesions or ulcers. NEUROLOGIC: Motor and sensation is grossly normal. Cranial nerves are grossly intact. PSYCH:  Oriented to person, place and time. Affect is normal.  Data Reviewed  I have personally reviewed the patient's imaging, laboratory findings and medical records.    Assessment/Plan 58 yo male w a symptomatic paraesophageal hernia. I had an extensive d/w the pt and family about his disease process. I do recommend robotic repair. Given the circumstances of COVID-19 we are only scheduling essential operations. At this time he is not showing any signs of incarceration but if symptoms worsen we may have to move up his surgery. I have also d/w the pt in detail about the procedure in detail. Risks, benefits and possible complications including but not limited to infection, bleeding, esophageal perforation and pain. We will see him back in 4 weeks and schedule his surgery. If symptoms worsens he knows that we may need to move up his surgery. Extensive counseling provided.  A copy of this report was sent to  referring provider. Caroleen Hamman, MD FACS General Surgeon 08/31/2018, 11:30 AM

## 2018-09-14 ENCOUNTER — Other Ambulatory Visit: Payer: Self-pay

## 2018-09-14 ENCOUNTER — Telehealth (INDEPENDENT_AMBULATORY_CARE_PROVIDER_SITE_OTHER): Payer: 59 | Admitting: Surgery

## 2018-09-14 DIAGNOSIS — K449 Diaphragmatic hernia without obstruction or gangrene: Secondary | ICD-10-CM

## 2018-09-14 NOTE — Progress Notes (Signed)
Telemedicine Surgical Follow Up  09/14/2018  Garrett Horton is an 58 y.o. male.   No chief complaint on file.   HPI:   He is following up via telephone conversation regarding paraesophageal hernia.  He states that he is doing much better.  His PPI seems to be working well.  No evidence of strangulation or incarceration of his hernia.  He continues to have mild symptoms but with a significant improvement.  Fevers no chills no evidence of respiratory symptoms Location of the patient:Work Time spent on call: 30min Total Time spent in the encounter including counseling and coordination of care: 11 Location of the Provider: Office The patient had given consent for a telemedicine visit and they understands the limitations associated with this including but not limited to privacy, cyber security and technology issues. Persons participating in the visit:pt    Past Medical History:  Diagnosis Date  . GERD (gastroesophageal reflux disease)   . Hernia, inguinal, bilateral   . Kidney stones     Past Surgical History:  Procedure Laterality Date  . COLONOSCOPY WITH PROPOFOL N/A 08/07/2018   Procedure: COLONOSCOPY WITH BIOPSIES;  Surgeon: Lucilla Lame, MD;  Location: [place];  Service: Endoscopy;  Laterality: N/A;  . CYSTOSCOPY W/ RETROGRADES Left 03/02/2015   Procedure: CYSTOSCOPY WITH RETROGRADE PYELOGRAM;  Surgeon: Alexis Frock, MD;  Location: [facility];  Service: Urology;  Laterality: Left;  . CYSTOSCOPY W/ RETROGRADES N/A 03/21/2015   Procedure: CYSTOSCOPY WITH RETROGRADE PYELOGRAM;  Surgeon: Ardis Hughs, MD;  Location: [facility];  Service: Urology;  Laterality: N/A;  . CYSTOSCOPY WITH STENT PLACEMENT Left 03/02/2015   Procedure: CYSTOSCOPY WITH STENT PLACEMENT;  Surgeon: Alexis Frock, MD;  Location: [facility];  Service: Urology;  Laterality: Left;  . CYSTOSCOPY/URETEROSCOPY/HOLMIUM LASER/STENT PLACEMENT Left 03/21/2015   Procedure: CYSTOSCOPY/URETEROSCOPY/STENT EXCHANGE ;   Surgeon: Ardis Hughs, MD;  Location: [facility];  Service: Urology;  Laterality: Left;  . ESOPHAGOGASTRODUODENOSCOPY (EGD) WITH PROPOFOL N/A 08/07/2018   Procedure: ESOPHAGOGASTRODUODENOSCOPY (EGD) WITH PROPOFOL;  Surgeon: Lucilla Lame, MD;  Location: [place];  Service: Endoscopy;  Laterality: N/A;  . HERNIA REPAIR    . POLYPECTOMY N/A 08/07/2018   Procedure: POLYPECTOMY;  Surgeon: Lucilla Lame, MD;  Location: [place];  Service: Endoscopy;  Laterality: N/A;  . VASECTOMY      Family History  Problem Relation Age of Onset  . Nephrolithiasis Father   . Nephrolithiasis Brother   . Prostate cancer Neg Hx   . Bladder Cancer Neg Hx     Social History:  reports that he quit smoking about 21 years ago. His smokeless tobacco use includes snuff. He reports current alcohol use. He reports that he does not use drugs.  Allergies: No Known Allergies  Medications reviewed.    ROS Full ROS performed and is otherwise negative other than what is stated in HPI   Assessment/Plan:  A paraesophageal hernia.  This time doing better on responded to PPI.  I will like to see in person again in about 6 weeks to have a face-to-face encounter and at that time decide about surgical intervention. The pt was provided an opportunity to ask questions and all were answered. The pt was advised to call back or seek in-person evaluation if the symptoms worsen or if the condition fails to improve as anticipated. Greater than 50% of the 11 minutes  visit was spent in counseling/coordination of care   Caroleen Hamman, MD [place] [facility]

## 2018-09-16 ENCOUNTER — Ambulatory Visit: Payer: 59 | Admitting: Surgery

## 2018-10-21 ENCOUNTER — Ambulatory Visit: Payer: 59 | Admitting: Surgery

## 2018-10-26 ENCOUNTER — Ambulatory Visit: Payer: 59 | Admitting: Surgery

## 2018-10-26 ENCOUNTER — Other Ambulatory Visit: Payer: Self-pay

## 2018-10-26 ENCOUNTER — Encounter: Payer: Self-pay | Admitting: Surgery

## 2018-10-26 VITALS — BP 143/90 | HR 71 | Temp 97.3°F | Resp 14 | Ht 70.5 in | Wt 192.0 lb

## 2018-10-26 DIAGNOSIS — K449 Diaphragmatic hernia without obstruction or gangrene: Secondary | ICD-10-CM | POA: Diagnosis not present

## 2018-10-26 NOTE — Patient Instructions (Addendum)
We have placed you in our recall box. We will reach out to you in October to schedule an November appointment to discuss the surgery.      Hiatal Hernia  A hiatal hernia occurs when part of the stomach slides above the muscle that separates the abdomen from the chest (diaphragm). A person can be born with a hiatal hernia (congenital), or it may develop over time. In almost all cases of hiatal hernia, only the top part of the stomach pushes through the diaphragm. Many people have a hiatal hernia with no symptoms. The larger the hernia, the more likely it is that you will have symptoms. In some cases, a hiatal hernia allows stomach acid to flow back into the tube that carries food from your mouth to your stomach (esophagus). This may cause heartburn symptoms. Severe heartburn symptoms may mean that you have developed a condition called gastroesophageal reflux disease (GERD). What are the causes? This condition is caused by a weakness in the opening (hiatus) where the esophagus passes through the diaphragm to attach to the upper part of the stomach. A person may be born with a weakness in the hiatus, or a weakness can develop over time. What increases the risk? This condition is more likely to develop in:  Older people. Age is a major risk factor for a hiatal hernia, especially if you are over the age of 72.  Pregnant women.  People who are overweight.  People who have frequent constipation. What are the signs or symptoms? Symptoms of this condition usually develop in the form of GERD symptoms. Symptoms include:  Heartburn.  Belching.  Indigestion.  Trouble swallowing.  Coughing or wheezing.  Sore throat.  Hoarseness.  Chest pain.  Nausea and vomiting. How is this diagnosed? This condition may be diagnosed during testing for GERD. Tests that may be done include:  X-rays of your stomach or chest.  An upper gastrointestinal (GI) series. This is an X-ray exam of your GI tract  that is taken after you swallow a chalky liquid that shows up clearly on the X-ray.  Endoscopy. This is a procedure to look into your stomach using a thin, flexible tube that has a tiny camera and light on the end of it. How is this treated? This condition may be treated by:  Dietary and lifestyle changes to help reduce GERD symptoms.  Medicines. These may include: ? Over-the-counter antacids. ? Medicines that make your stomach empty more quickly. ? Medicines that block the production of stomach acid (H2 blockers). ? Stronger medicines to reduce stomach acid (proton pump inhibitors).  Surgery to repair the hernia, if other treatments are not helping. If you have no symptoms, you may not need treatment. Follow these instructions at home: Lifestyle and activity  Do not use any products that contain nicotine or tobacco, such as cigarettes and e-cigarettes. If you need help quitting, ask your health care provider.  Try to achieve and maintain a healthy body weight.  Avoid putting pressure on your abdomen. Anything that puts pressure on your abdomen increases the amount of acid that may be pushed up into your esophagus. ? Avoid bending over, especially after eating. ? Raise the head of your bed by putting blocks under the legs. This keeps your head and esophagus higher than your stomach. ? Do not wear tight clothing around your chest or stomach. ? Try not to strain when having a bowel movement, when urinating, or when lifting heavy objects. Eating and drinking  Avoid foods  that can worsen GERD symptoms. These may include: ? Fatty foods, like fried foods. ? Citrus fruits, like oranges or lemon. ? Other foods and drinks that contain acid, like orange juice or tomatoes. ? Spicy food. ? Chocolate.  Eat frequent small meals instead of three large meals a day. This helps prevent your stomach from getting too full. ? Eat slowly. ? Do not lie down right after eating. ? Do not eat 1-2 hours  before bed.  Do not drink beverages with caffeine. These include cola, coffee, cocoa, and tea.  Do not drink alcohol. General instructions  Take over-the-counter and prescription medicines only as told by your health care provider.  Keep all follow-up visits as told by your health care provider. This is important. Contact a health care provider if:  Your symptoms are not controlled with medicines or lifestyle changes.  You are having trouble swallowing.  You have coughing or wheezing that will not go away. Get help right away if:  Your pain is getting worse.  Your pain spreads to your arms, neck, jaw, teeth, or back.  You have shortness of breath.  You sweat for no reason.  You feel sick to your stomach (nauseous) or you vomit.  You vomit blood.  You have bright red blood in your stools.  You have black, tarry stools. This information is not intended to replace advice given to you by your health care provider. Make sure you discuss any questions you have with your health care provider. Document Released: 08/17/2003 Document Revised: 12/30/2016 Document Reviewed: 12/30/2016 Elsevier Interactive Patient Education  2019 [employer].

## 2018-10-26 NOTE — Progress Notes (Signed)
Outpatient Surgical Follow Up  10/26/2018  Garrett Horton is an 58 y.o. male.   Chief Complaint  Patient presents with  . Follow-up    HPI: Garrett Horton is a 58 year old male following up for a symptomatic paraesophageal hernia.  He has started PPI with much improvement of his acid reflux.  He occasionally has some chest discomfort and indigestion.  That he attributes to gas.  No fevers no chills he is able to perform more than 6 METS of activity without any shortness of pain. The scan as well as barium swallow personally reviewed showing evidence of a large paraesophageal hernia with an upside down stomach.  Evidence of incarceration.  Past Medical History:  Diagnosis Date  . GERD (gastroesophageal reflux disease)   . Hernia, inguinal, bilateral   . Kidney stones     Past Surgical History:  Procedure Laterality Date  . COLONOSCOPY WITH PROPOFOL N/A 08/07/2018   Procedure: COLONOSCOPY WITH BIOPSIES;  Surgeon: Lucilla Lame, MD;  Location: [place];  Service: Endoscopy;  Laterality: N/A;  . CYSTOSCOPY W/ RETROGRADES Left 03/02/2015   Procedure: CYSTOSCOPY WITH RETROGRADE PYELOGRAM;  Surgeon: Alexis Frock, MD;  Location: [facility];  Service: Urology;  Laterality: Left;  . CYSTOSCOPY W/ RETROGRADES N/A 03/21/2015   Procedure: CYSTOSCOPY WITH RETROGRADE PYELOGRAM;  Surgeon: Ardis Hughs, MD;  Location: [facility];  Service: Urology;  Laterality: N/A;  . CYSTOSCOPY WITH STENT PLACEMENT Left 03/02/2015   Procedure: CYSTOSCOPY WITH STENT PLACEMENT;  Surgeon: Alexis Frock, MD;  Location: [facility];  Service: Urology;  Laterality: Left;  . CYSTOSCOPY/URETEROSCOPY/HOLMIUM LASER/STENT PLACEMENT Left 03/21/2015   Procedure: CYSTOSCOPY/URETEROSCOPY/STENT EXCHANGE ;  Surgeon: Ardis Hughs, MD;  Location: [facility];  Service: Urology;  Laterality: Left;  . ESOPHAGOGASTRODUODENOSCOPY (EGD) WITH PROPOFOL N/A 08/07/2018   Procedure: ESOPHAGOGASTRODUODENOSCOPY (EGD) WITH PROPOFOL;   Surgeon: Lucilla Lame, MD;  Location: [place];  Service: Endoscopy;  Laterality: N/A;  . HERNIA REPAIR    . POLYPECTOMY N/A 08/07/2018   Procedure: POLYPECTOMY;  Surgeon: Lucilla Lame, MD;  Location: [place];  Service: Endoscopy;  Laterality: N/A;  . VASECTOMY      Family History  Problem Relation Age of Onset  . Nephrolithiasis Father   . Nephrolithiasis Brother   . Prostate cancer Neg Hx   . Bladder Cancer Neg Hx     Social History:  reports that he quit smoking about 21 years ago. His smokeless tobacco use includes snuff. He reports current alcohol use. He reports that he does not use drugs.  Allergies: No Known Allergies  Medications reviewed.    ROS Full ROS performed and is otherwise negative other than what is stated in HPI   BP (!) 143/90   Pulse 71   Temp (!) 97.3 F (36.3 C)   Resp 14   Ht 5' 10.5" (1.791 m)   Wt 192 lb (87.1 kg)   SpO2 96%   BMI 27.16 kg/m   Physical Exam Vitals signs and nursing note reviewed.  Constitutional:      Appearance: Normal appearance.  Neck:     Musculoskeletal: Normal range of motion and neck supple. No neck rigidity.  Cardiovascular:     Rate and Rhythm: Normal rate and regular rhythm.     Pulses: Normal pulses.     Heart sounds: Normal heart sounds.  Pulmonary:     Effort: Pulmonary effort is normal. No respiratory distress.     Breath sounds: Normal breath sounds. No stridor.  Abdominal:  General: Abdomen is flat. There is no distension.     Palpations: There is no mass.     Tenderness: There is no abdominal tenderness. There is no guarding or rebound.     Hernia: No hernia is present.  Musculoskeletal: Normal range of motion.        General: No swelling.  Skin:    General: Skin is warm and dry.     Capillary Refill: Capillary refill takes less than 2 seconds.  Neurological:     General: No focal deficit present.     Mental Status: He is alert and oriented to person, place, and time.   Psychiatric:        Mood and Affect: Mood normal.        Behavior: Behavior normal.        Thought Content: Thought content normal.        Judgment: Judgment normal.        Assessment/Plan: Antibiotic paraesophageal hernia type III.  Discussed with the patient in detail and I do recommend elective robotic assisted paraesophageal hernia repair.  He is very active and wishes to wait until all before the elective surgical intervention is performed.  Currently there seems to be no signs of incarceration or complications regarding the paraesophageal hernia. Extensive discussion held with patient regarding his disease process and the proposed operation Greater than 50% of the 25 minutes  visit was spent in counseling/coordination of care   Caroleen Hamman, MD [place] [facility]

## 2019-02-25 ENCOUNTER — Other Ambulatory Visit: Payer: Self-pay | Admitting: Gastroenterology

## 2019-02-25 ENCOUNTER — Telehealth: Payer: Self-pay | Admitting: Gastroenterology

## 2019-03-02 NOTE — Telephone Encounter (Signed)
error 

## 2019-04-28 ENCOUNTER — Encounter: Payer: Self-pay | Admitting: Surgery

## 2019-04-28 ENCOUNTER — Other Ambulatory Visit: Payer: Self-pay

## 2019-04-28 ENCOUNTER — Ambulatory Visit: Payer: 59 | Admitting: Surgery

## 2019-04-28 VITALS — BP 130/80 | HR 89 | Temp 97.8°F | Ht 71.0 in | Wt 190.0 lb

## 2019-04-28 DIAGNOSIS — K449 Diaphragmatic hernia without obstruction or gangrene: Secondary | ICD-10-CM | POA: Diagnosis not present

## 2019-04-28 NOTE — Patient Instructions (Signed)
Return in 2 months

## 2019-04-30 ENCOUNTER — Encounter: Payer: Self-pay | Admitting: Surgery

## 2019-04-30 NOTE — Progress Notes (Signed)
Outpatient Surgical Follow Up  04/30/2019  Garrett Horton is an 58 y.o. male.   Chief Complaint  Patient presents with  . Follow-up    HPI: paraesophageal hernia, had another attack after having a soft drink and pork chops.Developed  Significant retrosternal pain . Did not come to the hospital. Now much better. Declines to wear a mask today.   Past Medical History:  Diagnosis Date  . GERD (gastroesophageal reflux disease)   . Hernia, inguinal, bilateral   . Kidney stones     Past Surgical History:  Procedure Laterality Date  . COLONOSCOPY WITH PROPOFOL N/A 08/07/2018   Procedure: COLONOSCOPY WITH BIOPSIES;  Surgeon: Lucilla Lame, MD;  Location: [place];  Service: Endoscopy;  Laterality: N/A;  . CYSTOSCOPY W/ RETROGRADES Left 03/02/2015   Procedure: CYSTOSCOPY WITH RETROGRADE PYELOGRAM;  Surgeon: Alexis Frock, MD;  Location: [facility];  Service: Urology;  Laterality: Left;  . CYSTOSCOPY W/ RETROGRADES N/A 03/21/2015   Procedure: CYSTOSCOPY WITH RETROGRADE PYELOGRAM;  Surgeon: Ardis Hughs, MD;  Location: [facility];  Service: Urology;  Laterality: N/A;  . CYSTOSCOPY WITH STENT PLACEMENT Left 03/02/2015   Procedure: CYSTOSCOPY WITH STENT PLACEMENT;  Surgeon: Alexis Frock, MD;  Location: [facility];  Service: Urology;  Laterality: Left;  . CYSTOSCOPY/URETEROSCOPY/HOLMIUM LASER/STENT PLACEMENT Left 03/21/2015   Procedure: CYSTOSCOPY/URETEROSCOPY/STENT EXCHANGE ;  Surgeon: Ardis Hughs, MD;  Location: [facility];  Service: Urology;  Laterality: Left;  . ESOPHAGOGASTRODUODENOSCOPY (EGD) WITH PROPOFOL N/A 08/07/2018   Procedure: ESOPHAGOGASTRODUODENOSCOPY (EGD) WITH PROPOFOL;  Surgeon: Lucilla Lame, MD;  Location: [place];  Service: Endoscopy;  Laterality: N/A;  . HERNIA REPAIR    . POLYPECTOMY N/A 08/07/2018   Procedure: POLYPECTOMY;  Surgeon: Lucilla Lame, MD;  Location: [place];  Service: Endoscopy;  Laterality: N/A;  . VASECTOMY      Family  History  Problem Relation Age of Onset  . Nephrolithiasis Father   . Nephrolithiasis Brother   . Prostate cancer Neg Hx   . Bladder Cancer Neg Hx     Social History:  reports that he quit smoking about 21 years ago. His smokeless tobacco use includes snuff. He reports current alcohol use. He reports that he does not use drugs.  Allergies: No Known Allergies  Medications reviewed.    ROS Full ROS performed and is otherwise negative other than what is stated in HPI   BP 130/80   Pulse 89   Temp 97.8 F (36.6 C) (Skin)   Ht 5\' 11"  (1.803 m)   Wt 190 lb (86.2 kg)   BMI 26.50 kg/m   Physical Exam NAD, alert Per pt preference no PE was performed   Assessment/Plan:  1. Paraesophageal hernia, symptomatic. D/W him at length about the rational for repair. Detailed surgical discussion was had with the pt. The procedure was described in detail, Robotic repair. THe expected outcomes, potential complications including but not limited to bleeding, infection, injuries, recurrences.  THe postoperative course as well as recovery. Emphasize about diet and lifting weight restrictions. With all that being said he wishes to hold of for now and will come back in 2 months so we can schedule his repair   Greater than 50% of the 30 minutes  visit was spent in counseling/coordination of care   Caroleen Hamman, MD [place] [facility]geon

## 2019-06-30 ENCOUNTER — Ambulatory Visit: Payer: 59 | Admitting: Surgery

## 2019-08-11 ENCOUNTER — Encounter: Payer: Self-pay | Admitting: Surgery

## 2019-08-11 ENCOUNTER — Ambulatory Visit: Payer: 59 | Admitting: Surgery

## 2019-08-11 ENCOUNTER — Other Ambulatory Visit: Payer: Self-pay

## 2019-08-11 VITALS — BP 138/93 | HR 69 | Temp 96.8°F | Resp 14 | Ht 70.5 in | Wt 193.8 lb

## 2019-08-11 DIAGNOSIS — K449 Diaphragmatic hernia without obstruction or gangrene: Secondary | ICD-10-CM | POA: Diagnosis not present

## 2019-08-11 NOTE — Patient Instructions (Addendum)
Patient will follow up with our office in November to discuss moving forward with surgery. Please contact our office if you have any questions or concerns.  Hiatal Hernia  A hiatal hernia occurs when part of the stomach slides above the muscle that separates the abdomen from the chest (diaphragm). A person can be born with a hiatal hernia (congenital), or it may develop over time. In almost all cases of hiatal hernia, only the top part of the stomach pushes through the diaphragm. Many people have a hiatal hernia with no symptoms. The larger the hernia, the more likely it is that you will have symptoms. In some cases, a hiatal hernia allows stomach acid to flow back into the tube that carries food from your mouth to your stomach (esophagus). This may cause heartburn symptoms. Severe heartburn symptoms may mean that you have developed a condition called gastroesophageal reflux disease (GERD). What are the causes? This condition is caused by a weakness in the opening (hiatus) where the esophagus passes through the diaphragm to attach to the upper part of the stomach. A person may be born with a weakness in the hiatus, or a weakness can develop over time. What increases the risk? This condition is more likely to develop in:  Older people. Age is a major risk factor for a hiatal hernia, especially if you are over the age of 88.  Pregnant women.  People who are overweight.  People who have frequent constipation. What are the signs or symptoms? Symptoms of this condition usually develop in the form of GERD symptoms. Symptoms include:  Heartburn.  Belching.  Indigestion.  Trouble swallowing.  Coughing or wheezing.  Sore throat.  Hoarseness.  Chest pain.  Nausea and vomiting. How is this diagnosed? This condition may be diagnosed during testing for GERD. Tests that may be done include:  X-rays of your stomach or chest.  An upper gastrointestinal (GI) series. This is an X-ray exam of  your GI tract that is taken after you swallow a chalky liquid that shows up clearly on the X-ray.  Endoscopy. This is a procedure to look into your stomach using a thin, flexible tube that has a tiny camera and light on the end of it. How is this treated? This condition may be treated by:  Dietary and lifestyle changes to help reduce GERD symptoms.  Medicines. These may include: ? Over-the-counter antacids. ? Medicines that make your stomach empty more quickly. ? Medicines that block the production of stomach acid (H2 blockers). ? Stronger medicines to reduce stomach acid (proton pump inhibitors).  Surgery to repair the hernia, if other treatments are not helping. If you have no symptoms, you may not need treatment. Follow these instructions at home: Lifestyle and activity  Do not use any products that contain nicotine or tobacco, such as cigarettes and e-cigarettes. If you need help quitting, ask your health care provider.  Try to achieve and maintain a healthy body weight.  Avoid putting pressure on your abdomen. Anything that puts pressure on your abdomen increases the amount of acid that may be pushed up into your esophagus. ? Avoid bending over, especially after eating. ? Raise the head of your bed by putting blocks under the legs. This keeps your head and esophagus higher than your stomach. ? Do not wear tight clothing around your chest or stomach. ? Try not to strain when having a bowel movement, when urinating, or when lifting heavy objects. Eating and drinking  Avoid foods that can worsen  GERD symptoms. These may include: ? Fatty foods, like fried foods. ? Citrus fruits, like oranges or lemon. ? Other foods and drinks that contain acid, like orange juice or tomatoes. ? Spicy food. ? Chocolate.  Eat frequent small meals instead of three large meals a day. This helps prevent your stomach from getting too full. ? Eat slowly. ? Do not lie down right after eating. ? Do not  eat 1-2 hours before bed.  Do not drink beverages with caffeine. These include cola, coffee, cocoa, and tea.  Do not drink alcohol. General instructions  Take over-the-counter and prescription medicines only as told by your health care provider.  Keep all follow-up visits as told by your health care provider. This is important. Contact a health care provider if:  Your symptoms are not controlled with medicines or lifestyle changes.  You are having trouble swallowing.  You have coughing or wheezing that will not go away. Get help right away if:  Your pain is getting worse.  Your pain spreads to your arms, neck, jaw, teeth, or back.  You have shortness of breath.  You sweat for no reason.  You feel sick to your stomach (nauseous) or you vomit.  You vomit blood.  You have bright red blood in your stools.  You have black, tarry stools. This information is not intended to replace advice given to you by your health care provider. Make sure you discuss any questions you have with your health care provider. Document Revised: 05/09/2017 Document Reviewed: 12/30/2016 Elsevier Patient Education  [employer].

## 2019-08-13 ENCOUNTER — Encounter: Payer: Self-pay | Admitting: Surgery

## 2019-08-13 NOTE — Progress Notes (Signed)
Outpatient Surgical Follow Up  08/13/2019  Garrett Horton is an 59 y.o. male.   Chief Complaint  Patient presents with  . Follow-up    discuss paraesophageal hernia surgery    HPI: Garrett Horton is a 59 year old male well-known to me with a history of paraesophageal hernia.  He continues to have some occasional chest discomfort.  Since the last visit he had another episode.  Chest tightness diaphoresis.  He states that this happens after he eats a big meal.  He has multiple signs and has postponed his operation.  Past Medical History:  Diagnosis Date  . GERD (gastroesophageal reflux disease)   . Hernia, inguinal, bilateral   . Kidney stones     Past Surgical History:  Procedure Laterality Date  . COLONOSCOPY WITH PROPOFOL N/A 08/07/2018   Procedure: COLONOSCOPY WITH BIOPSIES;  Surgeon: Lucilla Lame, MD;  Location: [place];  Service: Endoscopy;  Laterality: N/A;  . CYSTOSCOPY W/ RETROGRADES Left 03/02/2015   Procedure: CYSTOSCOPY WITH RETROGRADE PYELOGRAM;  Surgeon: Alexis Frock, MD;  Location: [facility];  Service: Urology;  Laterality: Left;  . CYSTOSCOPY W/ RETROGRADES N/A 03/21/2015   Procedure: CYSTOSCOPY WITH RETROGRADE PYELOGRAM;  Surgeon: Ardis Hughs, MD;  Location: [facility];  Service: Urology;  Laterality: N/A;  . CYSTOSCOPY WITH STENT PLACEMENT Left 03/02/2015   Procedure: CYSTOSCOPY WITH STENT PLACEMENT;  Surgeon: Alexis Frock, MD;  Location: [facility];  Service: Urology;  Laterality: Left;  . CYSTOSCOPY/URETEROSCOPY/HOLMIUM LASER/STENT PLACEMENT Left 03/21/2015   Procedure: CYSTOSCOPY/URETEROSCOPY/STENT EXCHANGE ;  Surgeon: Ardis Hughs, MD;  Location: [facility];  Service: Urology;  Laterality: Left;  . ESOPHAGOGASTRODUODENOSCOPY (EGD) WITH PROPOFOL N/A 08/07/2018   Procedure: ESOPHAGOGASTRODUODENOSCOPY (EGD) WITH PROPOFOL;  Surgeon: Lucilla Lame, MD;  Location: [place];  Service: Endoscopy;  Laterality: N/A;  . HERNIA REPAIR    . POLYPECTOMY N/A  08/07/2018   Procedure: POLYPECTOMY;  Surgeon: Lucilla Lame, MD;  Location: [place];  Service: Endoscopy;  Laterality: N/A;  . VASECTOMY      Family History  Problem Relation Age of Onset  . Nephrolithiasis Father   . Nephrolithiasis Brother   . Prostate cancer Neg Hx   . Bladder Cancer Neg Hx     Social History:  reports that he quit smoking about 22 years ago. His smokeless tobacco use includes snuff. He reports current alcohol use. He reports that he does not use drugs.  Allergies: No Known Allergies  Medications reviewed.    ROS Full ROS performed and is otherwise negative other than what is stated in HPI   BP (!) 138/93   Pulse 69   Temp (!) 96.8 F (36 C) (Temporal)   Resp 14   Ht 5' 10.5" (1.791 m)   Wt 193 lb 12.8 oz (87.9 kg)   SpO2 96%   BMI 27.41 kg/m   Physical Exam Vitals and nursing note reviewed. Exam conducted with a chaperone present.  Constitutional:      General: He is not in acute distress.    Appearance: Normal appearance. He is normal weight.  Eyes:     General: No scleral icterus.       Right eye: No discharge.        Left eye: No discharge.  Cardiovascular:     Rate and Rhythm: Normal rate and regular rhythm.  Pulmonary:     Effort: Pulmonary effort is normal. No respiratory distress.     Breath sounds: Normal breath sounds.  Abdominal:  General: There is no distension.     Tenderness: There is no abdominal tenderness. There is no guarding or rebound.     Hernia: No hernia is present.  Musculoskeletal:     Cervical back: Normal range of motion and neck supple. No rigidity.  Skin:    General: Skin is warm and dry.     Capillary Refill: Capillary refill takes less than 2 seconds.  Neurological:     General: No focal deficit present.     Mental Status: He is alert and oriented to person, place, and time.  Psychiatric:        Mood and Affect: Mood normal.        Behavior: Behavior normal.        Thought Content:  Thought content normal.        Judgment: Judgment normal.        Assessment/Plan: To my paraesophageal hernia.  Currently he wishes to wait until late fall before having this fixed. We will see him back in 6 months.  Greater than 50% of the 15 minutes  visit was spent in counseling/coordination of care   Caroleen Hamman, MD [place] Surgeon

## 2019-08-24 ENCOUNTER — Other Ambulatory Visit: Payer: Self-pay | Admitting: Gastroenterology

## 2020-01-21 ENCOUNTER — Other Ambulatory Visit: Payer: Self-pay | Admitting: Gastroenterology

## 2020-01-27 ENCOUNTER — Telehealth: Payer: Self-pay | Admitting: Gastroenterology

## 2020-01-27 ENCOUNTER — Other Ambulatory Visit: Payer: Self-pay

## 2020-01-27 ENCOUNTER — Other Ambulatory Visit: Payer: Self-pay | Admitting: Gastroenterology

## 2020-01-27 MED ORDER — PANTOPRAZOLE SODIUM 40 MG PO TBEC: 40.0000 mg | DELAYED_RELEASE_TABLET | Freq: Every day | ORAL | 3 refills | Status: DC

## 2020-01-27 NOTE — Telephone Encounter (Signed)
pantoprazole (PROTONIX) 40 MG tablet  [facility] in [place]  Patient states he is out of refills. Looks like we refilled it in 03/21.

## 2020-01-27 NOTE — Telephone Encounter (Signed)
LVM for patient to schedule a follow up appt.

## 2020-01-27 NOTE — Telephone Encounter (Signed)
Prescription refilled per pt request. Pt has been requested to schedule a one year follow up appt.

## 2020-04-19 ENCOUNTER — Other Ambulatory Visit: Payer: Self-pay | Admitting: Gastroenterology

## 2020-05-08 ENCOUNTER — Ambulatory Visit (INDEPENDENT_AMBULATORY_CARE_PROVIDER_SITE_OTHER): Payer: 59 | Admitting: Surgery

## 2020-05-08 ENCOUNTER — Other Ambulatory Visit: Payer: Self-pay

## 2020-05-08 ENCOUNTER — Encounter: Payer: Self-pay | Admitting: Surgery

## 2020-05-08 VITALS — BP 149/100 | HR 71 | Temp 98.1°F | Ht 70.5 in | Wt 197.0 lb

## 2020-05-08 DIAGNOSIS — K449 Diaphragmatic hernia without obstruction or gangrene: Secondary | ICD-10-CM | POA: Diagnosis not present

## 2020-05-08 NOTE — Patient Instructions (Signed)
We have spoken today about repairing your Hiatal Hernia. Your surgery will be scheduled for 05/18/20 with Dr. Dahlia Byes.  Plan to be in the hospital for 2-3 days if the minimally invasive surgery is completed without having to make a bigger incision. If the bigger incision is made, you will most likely need to be in the hospital 7-10 days. You will be on a restricted diet and need to recover for 2 weeks following your surgery prior to doing any of your normal activities. At the 2 week mark, we will see you in the office and if you are doing ok we will advance your diet and activity level as you tolerate.  Please see your Blue (Pre-care) Sheet for more information regarding your surgery. Our surgery scheduler will be in contact with you to go over surgery date and information.   Please call our office with any questions or concerns that you have regarding your surgery and recovery.     Laparoscopic Nissen Fundoplication Laparoscopic Nissen fundoplication is surgery to relieve heartburn and other problems caused by gastric fluids flowing up into your esophagus. The esophagus is the tube that carries food and liquid from your throat to your stomach. Normally, the muscle that sits between your stomach and your esophagus (lower esophageal sphincter or LES) keeps stomach fluids in your stomach. In some people, the LES does not work properly, and stomach fluids flow up into the esophagus. This can happen when part of the stomach bulges through the LES (hiatal hernia). The backward flow of stomach fluids can cause a type of severe and long-standing heartburn that is called gastroesophageal reflux disease (GERD). You may need this surgery if other treatments for GERD have not helped. In a laparoscopic Nissen fundoplication, the upper part of your stomach is wrapped around the lower part of your esophagus to strengthen the LES and prevent reflux. If you have a hiatal hernia, it will also be repaired with this  surgery. The procedure is done through several small incisions in your abdomen. It is performed using a thin, telescopic instrument (laparoscope) and other instruments that can pass through the scope or through other small incisions. Tell a health care provider about:  Any allergies you have.  All medicines you are taking, including vitamins, herbs, eye drops, creams, and over-the-counter medicines.  Any problems you or family members have had with anesthetic medicines.  Any blood disorders you have.  Any surgeries you have had.  Any medical conditions you have. What are the risks? Generally, this is a safe procedure. However, problems may occur, including:  Difficulty swallowing (dysphagia).  Bloating.  Nausea or vomiting.  Damage to the lung, causing a collapsed lung.  Infection or bleeding. What happens before the procedure?  Ask your health care provider about:  Changing or stopping your regular medicines. This is especially important if you are taking diabetes medicines or blood thinners.  Taking medicines such as aspirin and ibuprofen. These medicines can thin your blood. Do not take these medicines before your procedure if your health care provider asks you not to.  Follow your health care provider's instructions about eating or drinking restrictions.  Plan to have someone take you home after the procedure. What happens during the procedure?  An IV tube will be inserted into one of your veins. It will be used to give you fluids and medicines during the procedure.  You will be given a medicine that makes you fall asleep (general anesthetic).  Your abdomen will be cleaned  with a germ-killing solution (antiseptic).  The surgeon will make a small incision in your abdomen and insert a tube through the incision.  Your abdomen will be filled with a gas. This helps the surgeon to see your organs more easily and it makes more space to work.  The surgeon will insert the  laparoscope through the incision. The scope has a camera that will send pictures to a monitor in the operating room.  The surgeon will make several other small incisions in your abdomen to insert the other instruments that are needed during the procedure.  Another instrument (dilator) will be passed through your mouth and down your esophagus into the upper part of your stomach. The dilator will prevent your LES from being closed too tightly during surgery.  The surgeon will pass the top portion of your stomach behind the lower part of your esophagus and wrap it all the way around. This will be stitched into place.  If you have a hiatal hernia, it will be repaired during this procedure.  All instruments will be removed, and your incisions will be closed under your skin with stitches (sutures). Skin adhesive strips may also be used.  A bandage (dressing) will be placed on your skin over the incisions. The procedure may vary among health care providers and hospitals. What happens after the procedure?  You will be moved to a recovery area.  Your blood pressure, heart rate, breathing rate, and blood oxygen level will be monitored often until the medicines you were given have worn off.  You will be given pain medicine as needed.  Your IV tube will be kept in until you are able to drink fluids. This information is not intended to replace advice given to you by your health care provider. Make sure you discuss any questions you have with your health care provider. Document Released: 06/17/2014 Document Revised: 11/02/2015 Document Reviewed: 01/26/2014 Elsevier Interactive Patient Education  2017 [employer]   Laparoscopic Nissen Fundoplication, Care After Refer to this sheet in the next few weeks. These instructions provide you with information about caring for yourself after your procedure. Your health care provider may also give you more specific instructions. Your treatment has been planned  according to current medical practices, but problems sometimes occur. Call your health care provider if you have any problems or questions after your procedure. What can I expect after the procedure? After the procedure, it is common to have:  Difficulty swallowing (dysphagia).  Excess gas (bloating). Follow these instructions at home: Medicines   Take medicines only as directed by your health care provider.  Do not drive or operate heavy machinery while taking pain medicine. Incision care   There are many different ways to close and cover an incision, including stitches (sutures), skin glue, and adhesive strips. Follow your health care provider's instructions about:  Incision care.  Bandage (dressing) changes and removal.  Incision closure removal.  Check your incision areas every day for signs of infection. Watch for:  Redness, swelling, or pain.  Fluid, blood, or pus.  Do not take baths, swim, or use a hot tub until your health care provider approves. Take showers as directed by your health care provider. Eating and drinking   Follow your health care provider's instructions about eating.  You may need to follow a restrictive diet for 2 weeks, followed by a diet of soft foods for 2 weeks.  You should return to your usual diet gradually.  Drink enough fluid to keep  your urine clear or pale yellow. Activity   Return to your normal activities as directed by your health care provider. Ask your health care provider what activities are safe for you.  Avoid strenuous exercise.  Do not lift anything that is heavier than 10 lb (4.5 kg).  Ask your health care provider when you can:  Return to sexual activity.  Drive.  Go back to work. Contact a health care provider if:  You have a fever.  Your pain gets worse or is not helped by medicine.  You have frequent nausea or vomiting.  You have continued abdominal bloating.  You have an ongoing (persistent)  cough.  You have redness, swelling, or pain in any incision areas.  You have fluid, blood, or pus coming from any incisions. Get help right away if:  You have trouble breathing.  You are unable to swallow.  You have persistent vomiting.  You have blood in your vomit.  You have severe abdominal pain. This information is not intended to replace advice given to you by your health care provider. Make sure you discuss any questions you have with your health care provider. Document Released: 01/18/2004 Document Revised: 11/02/2015 Document Reviewed: 01/26/2014 Elsevier Interactive Patient Education  2017 [employer].   Diet After Nissen Fundoplication Surgery This diet information is for patients who have recently had Nissen fundoplication surgery to correct reflux disease or to repair various types of hernias, such as hiatal hernia and intrathoracic stomach. This diet may also be used for other gastrointestinal surgeries, such as Heller myotomy and repair of achalasia. The diet will help control diarrhea, excess gas and swallowing problems, which may occur after this type of surgery. Keeping Your Stomach from Stretching Eat small, frequent meals (six to eight per day). This will help you consume the majority of the nutrients you need without causing your stomach to feel full or distended.  Drinking large amounts of fluids with meals can stretch your stomach. You may drink fluids between meals as often as you like, but limit fluids to 1/2 cup (4 fluid ounces) with meals and one cup (8 fluid ounces) with snacks.  Sit upright while eating and stay upright for 30 minutes after each meal. Gravity can help food move through your digestive tract. Do not lie down after eating. Sit upright for 2 hours after your last meal or snack of the day.  Eat very slowly. Take your time when eating.  Take small bites and chew your food well to help aid in swallowing and digestion.  Avoid crusty breads and  sticky, gummy foods, such as bananas, fresh doughy breads, rolls and doughnuts. These types of foods become sticky and difficult to swallow.  Toasted breads tend to be better tolerated.  Lastly, if you eat sweets, consume them at the end of your meal to avoid a group of symptoms referred to as "dumping syndrome". This describes the rapid emptying of foods from the stomach to the small intestine. Sweetened beverages, candy and desserts move more rapidly and dump quickly into the intestines. This can cause symptoms of nausea, weakness, cold sweats, cramps, diarrhea and dizzy spells.  Avoiding Gas Avoid drinking through a straw. Do not chew gum or tobacco. These actions cause you to swallow air, which produces excess gas in your stomach. Chew with your mouth closed.  Avoid any foods that cause stomach gas and distention. These foods include corn, dried beans, peas, lentils, onions, broccoli, cauliflower and any food from the cabbage family.  Avoid carbonated drinks, alcohol, citrus and tomato products.  When will I be able to eat a soft diet? After Nissen fundoplication surgery, your diet will be advanced slowly by your surgeon. Generally, you will be on a clear liquid diet for the first few meals. Then you will advance to the full liquid diet for a meal or two and eventually to a Nissen soft diet. Please be aware that each patient's tolerance to food is different. Your doctor will advance your diet depending on how well you progress after surgery. Clear Liquid Diet  The first diet after surgery is the clear liquid diet. It includes the following liquids: Apple juice  Cranberry juice  Grape juice  Chicken broth  Beef broth  Flavored gelatin (Jell-O)  Decaf tea and coffee  Caffeinated beverages are permitted based on tolerance  Popsicles  [place] ice Carbonated drinks (sodas) are not allowed for the first six to eight weeks after surgery. After this time you can try them again in small  amounts.  Full Liquid Diet The full liquid diet contains anything on the clear liquid diet, plus: Milk, soy, rice and almond (no chocolate)  Cream of wheat, cream of rice, grits  Strained creamed soups (no tomato or broccoli)  Vanilla and strawberry-flavored ice cream  Sherbet  Blended, custard styled or whipped yogurt (plain or vanilla only)  Vanilla and butterscotch pudding (no chocolate or coconut)  Nutritional drinks including Ensure, Boost, Carnation Instant Breakfast (no chocolate-flavored) Note: Dairy products, such as milk, ice cream and pudding, may cause diarrhea in some people just after surgery. You may need to avoid milk products. If so, substitute them with lactose-free beverages, such as soy, rice, Lactaid or almond milks.  Nissen Soft Diet Food Category Foods to Choose Foods to Avoid  Beverages Milk, such as, whole, 2%, 1%, non-fat, or skim, soy, rice, almond  Caffeinated and decaf tea and coffee  Powdered drink mixes (in moderation)  Non-citrus juices (apple, grape, cranberry or blends of these)  Fruit nectars  Nutritional drinks including Boost, Ensure, Carnation Instant Breakfast Chocolate milk, cocoa or other chocolate-flavored drinks  Carbonated drinks  Alcohol  Citrus juices like orange, grapefruit, lemon and lime  Breads Pancakes, [place] toast and waffles  Crackers (saltine, butter, soda, graham, Goldfish and Cheese Nips)  Toasted bread Untoasted bread, bagels, [place] and hard rolls, English muffins  Crackers with nuts, seeds, fresh or dried fruit, coconut, or highly seasoned, such as garlic or onion-flavored  Sweet rolls, coffee cake or doughnuts  Cereals Well cooked cereals, such as oatmeal (plain or flavored)  Cold cereal (Cornflakes, Rice Krispies, Cheerios, Special K plain, Rice Chex and puffed rice) Very coarse cereal, such as bran, shredded wheat  Any cereal with fresh or dried fruit, coconut, seeds or nuts  Desserts Eat in moderation and  do not eat desserts or sweets by themselves. Plain cakes, cookies and cream-filled pies  Vanilla and butterscotch pudding or custard  Ice cream, ice milk, frozen yogurt and sherbet  Gelatin made from allowed foods  Fruit ices and popsicles Desserts containing chocolate, coconut, nuts, seeds, fresh or dried fruit, peppermint or spearmint  Eggs  Poached, hard boiled or scrambled Fried eggs and highly seasoned eggs (deviled eggs)  Fats Eat in moderation. Butter and margarine  Mayonnaise and vegetable oils  Mildly seasoned cream sauces and gravies  Plain cream cheese  Sour cream Highly seasoned salad dressings, cream sauces and gravies  Bacon, bacon fat, ham fat, lard and salt pork  Fried foods  Nuts  Fruits Fruit juice  Any canned or cooked fruit except those listed in the AVOID column ALL fresh fruits, such as citrus, bananas and pineapple  Canned pineapple  Dried fruits, such as raisins, berries  Fruits with seeds, such as berries, kiwi and figs  Meat, Fish, Poultry, and [employer] may be ground, minced or chopped to ease swallowing and digestion  Tender, well cooked and moist cuts of beef, chicken, [place] and pork  Veal and lamb  Flaky, cooked fish  Canned tuna  Cottage and ricotta cheeses  Mild cheese, such as American, brick, mozzarella and baby Swiss  Creamy peanut butter  Plain custard or blended fruit yogurt  Moist casseroles, such as macaroni & cheese, tuna noodle  Grilled or toasted cheese sandwich Tough meats with a lot of gristle  Fried, highly seasoned, smoked and fatty meat, fish or poultry, such as frankfurters, luncheon meats, sausage, bacon, spare ribs, beef brisket, sardines, anchovies, duck and goose  Chili and other entrees made with pepper or chili pepper  Shellfish  Strongly flavored cheeses, such as sharp cheese, extra sharp cheddar, cheese containing peppers or other seasonings  Crunchy peanut butter  Any yogurt with nuts, seeds, coconut,  strawberries or raspberries  Potatoes and Starches Peeled, mashed or boiled white or sweet potatoes  Oven-baked potatoes without skin  Well cooked white rice, enriched noodles, barley, spaghetti, macaroni and other pastas Fried potatoes, potato skins and potato chips  Hard and soft taco shells  Fried, brown or wild rice  Soups Mildly flavored meat stocks  Cream soups made from allowed foods Highly seasoned soups and tomato based soups, cream soups made with gas producing vegetables, such as broccoli, cauliflower, onion, etc.  Sweets and Snacks Use in moderation and do not eat large amounts of sweets by themselves. Syrup, honey, jelly and seedless jam  Plain hard candies and plain candies made with allowed ingredients  Molasses  Marshmallows  Other candy made from allowed ingredients  Thin pretzels Jam, marmalade and preserves  Chocolate in any form  Any candy containing nuts, coconut, seeds, peppermint, spearmint or dried or fresh fruit  Popcorn, potato chips, tortilla chips  Soft or hard thick pretzels, such as sourdough  Vegetables Well cooked soft vegetables without seeds or skins, such as asparagus tips, beets, carrots, green and wax beans, chopped spinach, tender canned baby peas, squash and pumpkin Raw vegetables, tomatoes, tomato juice, tomato sauce and V-8 juice  Gas producing vegetables, such as broccoli, Brussel sprouts, cabbage, cauliflower, onions, corn, cucumber, green peppers, rutabagas, turnips, radishes and sauerkraut  Dried beans, peas and lentils  Miscellaneous Salt and spices in moderation  Mustard and vinegar in moderation Fried or highly seasoned foods  Coconut and seeds  Pickles and olives  Chili sauces, ketchup, barbecue sauce, horseradish, black pepper, chili powder and onion and garlic seasonings  Any other strongly flavored seasoning, condiment, spice or herb not tolerated  Any food not tolerated

## 2020-05-09 ENCOUNTER — Telehealth: Payer: Self-pay | Admitting: Surgery

## 2020-05-09 NOTE — Telephone Encounter (Signed)
Outgoing call is made, left message for patient to call.  Please advise patient of Pre-Admission date/time, COVID Testing date and Surgery date.  Surgery Date: 05/18/20 Preadmission Testing Date: 05/11/20 (phone 1--5p) Covid Testing Date: 05/16/20 - patient advised to go to the [facility] ([place]) between 8a-1p   Also patient needs to call [phone], between 1-3:00pm the day before surgery, to find out what time to arrive for surgery.

## 2020-05-09 NOTE — Telephone Encounter (Signed)
Received call back, spoke with wife, Maudie Mercury.  They both are informed of all dates regarding surgery and voice understanding.

## 2020-05-10 ENCOUNTER — Encounter: Payer: Self-pay | Admitting: Surgery

## 2020-05-10 NOTE — Progress Notes (Signed)
Surgical Consultation  05/10/2020  Garrett Horton is an 59 y.o. male.   Chief Complaint  Patient presents with  . Follow-up     HPI: Garrett Horton is a 59 year old male well-known to me with a history of paraesophageal hernia.    He is doing well and currently reflux is controlled with PPI. He does have occasional attacks of chest pains if he eats heavy meals.  Becomes diaphoretic and significant distress.  He has not had any of these attacks in a while.  He is ready to move forward with surgery. I again personally reviewed his barium swallow as well as a CT scan there is evidence of moderate to large paraesophageal hernia, no strictures or masses.  Mild malrotation of the stomach. He is able to perform more than 6 METS of activity without any shortness of breath or chest pain.   Past Medical History:  Diagnosis Date  . GERD (gastroesophageal reflux disease)   . Hernia, inguinal, bilateral   . Kidney stones     Past Surgical History:  Procedure Laterality Date  . COLONOSCOPY WITH PROPOFOL N/A 08/07/2018   Procedure: COLONOSCOPY WITH BIOPSIES;  Surgeon: Lucilla Lame, MD;  Location: [place];  Service: Endoscopy;  Laterality: N/A;  . CYSTOSCOPY W/ RETROGRADES Left 03/02/2015   Procedure: CYSTOSCOPY WITH RETROGRADE PYELOGRAM;  Surgeon: Alexis Frock, MD;  Location: [facility];  Service: Urology;  Laterality: Left;  . CYSTOSCOPY W/ RETROGRADES N/A 03/21/2015   Procedure: CYSTOSCOPY WITH RETROGRADE PYELOGRAM;  Surgeon: Ardis Hughs, MD;  Location: [facility];  Service: Urology;  Laterality: N/A;  . CYSTOSCOPY WITH STENT PLACEMENT Left 03/02/2015   Procedure: CYSTOSCOPY WITH STENT PLACEMENT;  Surgeon: Alexis Frock, MD;  Location: [facility];  Service: Urology;  Laterality: Left;  . CYSTOSCOPY/URETEROSCOPY/HOLMIUM LASER/STENT PLACEMENT Left 03/21/2015   Procedure: CYSTOSCOPY/URETEROSCOPY/STENT EXCHANGE ;  Surgeon: Ardis Hughs, MD;  Location: [facility];  Service: Urology;   Laterality: Left;  . ESOPHAGOGASTRODUODENOSCOPY (EGD) WITH PROPOFOL N/A 08/07/2018   Procedure: ESOPHAGOGASTRODUODENOSCOPY (EGD) WITH PROPOFOL;  Surgeon: Lucilla Lame, MD;  Location: [place];  Service: Endoscopy;  Laterality: N/A;  . HERNIA REPAIR    . POLYPECTOMY N/A 08/07/2018   Procedure: POLYPECTOMY;  Surgeon: Lucilla Lame, MD;  Location: [place];  Service: Endoscopy;  Laterality: N/A;  . VASECTOMY      Family History  Problem Relation Age of Onset  . Nephrolithiasis Father   . Nephrolithiasis Brother   . Prostate cancer Neg Hx   . Bladder Cancer Neg Hx     Social History:  reports that he quit smoking about 22 years ago. His smokeless tobacco use includes snuff. He reports current alcohol use. He reports that he does not use drugs.  Allergies: No Known Allergies  Medications reviewed.     ROS Full ROS performed and is otherwise negative other than what is stated in the HPI    BP (!) 149/100   Pulse 71   Temp 98.1 F (36.7 C)   Ht 5' 10.5" (1.791 m)   Wt 197 lb (89.4 kg)   SpO2 95%   BMI 27.87 kg/m   Physical Exam Vitals and nursing note reviewed. Exam conducted with a chaperone present.  Constitutional:      General: He is not in acute distress.    Appearance: Normal appearance. He is normal weight. He is not toxic-appearing.  Eyes:     General: No scleral icterus.       Right eye: No discharge.  Left eye: No discharge.  Cardiovascular:     Rate and Rhythm: Normal rate and regular rhythm.     Heart sounds: No murmur heard.   Pulmonary:     Effort: Pulmonary effort is normal. No respiratory distress.     Breath sounds: Normal breath sounds. No stridor. No rhonchi.  Abdominal:     General: Abdomen is flat. There is no distension.     Palpations: Abdomen is soft. There is no mass.     Tenderness: There is no abdominal tenderness. There is no guarding or rebound.     Hernia: No hernia is present.  Musculoskeletal:         General: No swelling or tenderness. Normal range of motion.     Cervical back: Normal range of motion and neck supple. No rigidity or tenderness.  Skin:    General: Skin is warm and dry.     Capillary Refill: Capillary refill takes less than 2 seconds.  Neurological:     General: No focal deficit present.     Mental Status: He is alert and oriented to person, place, and time.  Psychiatric:        Mood and Affect: Mood normal.        Behavior: Behavior normal.        Thought Content: Thought content normal.        Judgment: Judgment normal.      Assessment/Plan: 59 year old male with a symptomatic paraesophageal hernia type III in need for repair.  I definitely recommend surgical intervention.  I do think that he is a good candidate for robotic approach.  Procedure discussed with the patient and his wife in detail.  Risks, benefits and possible occasions including but not limited to: Bleeding, infection, recurrence, esophageal injuries, chronic bloating syndrome, conversion to open, pneumothorax, dysphagia.  He understands and wishes to proceed.  ExTensive counseling provided. Please note that I spent more than 40 minutes in this encounter with greater than 50% spent in coordination and counseling of his care   Caroleen Hamman, MD [facility]

## 2020-05-11 ENCOUNTER — Encounter
Admission: RE | Admit: 2020-05-11 | Discharge: 2020-05-11 | Disposition: A | Discharge status: Home / Self Care | Payer: 59 | Source: Ambulatory Visit | Attending: Surgery | Admitting: Surgery

## 2020-05-11 ENCOUNTER — Other Ambulatory Visit: Payer: Self-pay

## 2020-05-11 HISTORY — DX: Personal history of urinary calculi: Z87.442

## 2020-05-11 NOTE — Patient Instructions (Addendum)
Your procedure is scheduled on: Thursday, December 9 Report to the Registration Desk on the 1st floor of the [facility]. To find out your arrival time, please call [phone] between 1PM - 3PM on: Wednesday, December 8  REMEMBER: Instructions that are not followed completely may result in serious medical risk, up to and including death; or upon the discretion of your surgeon and anesthesiologist your surgery may need to be rescheduled.  Do not eat food after midnight the night before surgery.  No gum chewing, lozengers or hard candies.  You may however, drink CLEAR liquids up to 2 hours before you are scheduled to arrive for your surgery. Do not drink anything within 2 hours of your scheduled arrival time.  Clear liquids include: - water  - apple juice without pulp - gatorade (not RED, PURPLE, OR BLUE) - black coffee or tea (Do NOT add milk or creamers to the coffee or tea) Do NOT drink anything that is not on this list.  TAKE THESE MEDICATIONS THE MORNING OF SURGERY WITH A SIP OF WATER:  1.  Pantoprazole (Protonix) - (take one the night before and one on the morning of surgery - helps to prevent nausea after surgery.)  One week prior to surgery: Stop Anti-inflammatories (NSAIDS) such as Advil, Aleve, Ibuprofen, Motrin, Naproxen, Naprosyn and Aspirin based products such as Excedrin, Goodys Powder, BC Powder. Stop ANY OVER THE COUNTER supplements until after surgery.  No Alcohol for 24 hours before or after surgery.  No Smoking including e-cigarettes for 24 hours prior to surgery.  No chewable tobacco products for at least 6 hours prior to surgery.  No nicotine patches on the day of surgery.  Do not use any "recreational" drugs for at least a week prior to your surgery.  Please be advised that the combination of cocaine and anesthesia may have negative outcomes, up to and including death. If you test positive for cocaine, your surgery will be cancelled.  On the morning of  surgery brush your teeth with toothpaste and water, you may rinse your mouth with mouthwash if you wish. Do not swallow any toothpaste or mouthwash.  Do not wear jewelry.  Do not wear lotions, powders, or perfumes.   Do not bring valuables to the hospital. [facility] is not responsible for any missing/lost belongings or valuables.   Notify your doctor if there is any change in your medical condition (cold, fever, infection).  If you are being discharged the day of surgery, you will not be allowed to drive home. You will need a responsible adult (18 years or older) to drive you home and stay with you that night.   If you are taking public transportation, you will need to have a responsible adult (18 years or older) with you. Please confirm with your physician that it is acceptable to use public transportation.   Please call the [place] [facility] at [phone] if you have any questions about these instructions.  Visitation Policy:  Patients undergoing a surgery or procedure may have one family member or support person with them as long as that person is not COVID-19 positive or experiencing its symptoms.  That person may remain in the waiting area during the procedure.

## 2020-05-16 ENCOUNTER — Other Ambulatory Visit
Admission: RE | Admit: 2020-05-16 | Discharge: 2020-05-16 | Disposition: A | Discharge status: Home / Self Care | Payer: 59 | Source: Ambulatory Visit | Attending: Surgery | Admitting: Surgery

## 2020-05-16 ENCOUNTER — Other Ambulatory Visit: Payer: Self-pay

## 2020-05-16 DIAGNOSIS — Z20822 Contact with and (suspected) exposure to covid-19: Secondary | ICD-10-CM | POA: Diagnosis not present

## 2020-05-16 DIAGNOSIS — Z01812 Encounter for preprocedural laboratory examination: Secondary | ICD-10-CM | POA: Insufficient documentation

## 2020-05-16 LAB — CBC WITH DIFFERENTIAL/PLATELET
Abs Immature Granulocytes: 0.06 10*3/uL (ref 0.00–0.07)
Basophils Absolute: 0.1 10*3/uL (ref 0.0–0.1)
Basophils Relative: 1 %
Eosinophils Absolute: 0.1 10*3/uL (ref 0.0–0.5)
Eosinophils Relative: 1 %
HCT: 49.8 % (ref 39.0–52.0)
Hemoglobin: 16.6 g/dL (ref 13.0–17.0)
Immature Granulocytes: 0 %
Lymphocytes Relative: 17 %
Lymphs Abs: 2.3 10*3/uL (ref 0.7–4.0)
MCH: 29.9 pg (ref 26.0–34.0)
MCHC: 33.3 g/dL (ref 30.0–36.0)
MCV: 89.6 fL (ref 80.0–100.0)
Monocytes Absolute: 0.9 10*3/uL (ref 0.1–1.0)
Monocytes Relative: 7 %
Neutro Abs: 10 10*3/uL — ABNORMAL HIGH (ref 1.7–7.7)
Neutrophils Relative %: 74 %
Platelets: 253 10*3/uL (ref 150–400)
RBC: 5.56 MIL/uL (ref 4.22–5.81)
RDW: 12.8 % (ref 11.5–15.5)
WBC: 13.6 10*3/uL — ABNORMAL HIGH (ref 4.0–10.5)
nRBC: 0 % (ref 0.0–0.2)

## 2020-05-16 LAB — COMPREHENSIVE METABOLIC PANEL
ALT: 37 U/L (ref 0–44)
AST: 23 U/L (ref 15–41)
Albumin: 4.1 g/dL (ref 3.5–5.0)
Alkaline Phosphatase: 108 U/L (ref 38–126)
Anion gap: 11 (ref 5–15)
BUN: 19 mg/dL (ref 8–23)
CO2: 25 mmol/L (ref 22–32)
Calcium: 9.4 mg/dL (ref 8.9–10.3)
Chloride: 103 mmol/L (ref 98–111)
Creatinine, Ser: 1.09 mg/dL (ref 0.61–1.24)
GFR, Estimated: >60 mL/min (ref >60)
Glucose, Bld: 132 mg/dL — ABNORMAL HIGH (ref 70–99)
Potassium: 3.7 mmol/L (ref 3.5–5.1)
Sodium: 139 mmol/L (ref 135–145)
Total Bilirubin: 0.7 mg/dL (ref 0.3–1.2)
Total Protein: 8 g/dL (ref 6.5–8.1)

## 2020-05-16 LAB — PROTIME-INR
INR: 0.9 (ref 0.8–1.2)
Prothrombin Time: 12.2 seconds (ref 11.4–15.2)

## 2020-05-17 ENCOUNTER — Telehealth: Payer: Self-pay | Admitting: Surgery

## 2020-05-17 LAB — SARS CORONAVIRUS 2 (TAT 6-24 HRS): SARS Coronavirus 2: NEGATIVE

## 2020-05-17 NOTE — Telephone Encounter (Signed)
Incoming call from patient.  He woke up this morning with irritated throat, no fever, no cough.  Just throat bothering him.  He has surgery scheduled for 05/18/20.  Telephone call to the OR, spoke with Will who also spoke with charge nurse.  Per the OR, patient is to arrive for a rapid Covid test @ 8:00 am at the drive thru of the [employer], he is to remain in the car until they call him with the results and will inform him then if all ok when to arrive for his surgery.  Patient is informed of this and voices understanding.

## 2020-05-18 ENCOUNTER — Telehealth: Payer: Self-pay | Admitting: Surgery

## 2020-05-18 ENCOUNTER — Inpatient Hospital Stay: Admission: RE | Admit: 2020-05-18 | Payer: 59 | Source: Home / Self Care | Admitting: Surgery

## 2020-05-18 ENCOUNTER — Encounter: Admission: RE | Payer: Self-pay | Source: Home / Self Care

## 2020-05-18 SURGERY — REPAIR, HERNIA, PARAESOPHAGEAL, ROBOT-ASSISTED
Anesthesia: General

## 2020-05-18 NOTE — Telephone Encounter (Signed)
Outgoing call is made.  Spoke with patient and overnight he developed additional cold symptoms.  He called the OR and cancelled his surgery for today (05/18/20).  Dr. Dahlia Byes would like to see the patient back in the office in 2-3 weeks or even the first part of January.  When offering to schedule an appointment, patient declined scheduling.  Patient states he will call back.  He is wished well.

## 2020-08-17 ENCOUNTER — Other Ambulatory Visit: Payer: Self-pay | Admitting: Gastroenterology

## 2020-09-24 ENCOUNTER — Other Ambulatory Visit: Payer: Self-pay

## 2020-09-24 ENCOUNTER — Ambulatory Visit
Admission: EM | Admit: 2020-09-24 | Discharge: 2020-09-24 | Disposition: A | Discharge status: Home / Self Care | Payer: 59 | Attending: Sports Medicine | Admitting: Sports Medicine

## 2020-09-24 ENCOUNTER — Encounter: Payer: Self-pay | Admitting: Emergency Medicine

## 2020-09-24 DIAGNOSIS — U071 COVID-19: Secondary | ICD-10-CM | POA: Insufficient documentation

## 2020-09-24 DIAGNOSIS — R059 Cough, unspecified: Secondary | ICD-10-CM | POA: Insufficient documentation

## 2020-09-24 DIAGNOSIS — R197 Diarrhea, unspecified: Secondary | ICD-10-CM | POA: Diagnosis not present

## 2020-09-24 DIAGNOSIS — R5383 Other fatigue: Secondary | ICD-10-CM | POA: Insufficient documentation

## 2020-09-24 LAB — RESP PANEL BY RT-PCR (FLU A&B, COVID) ARPGX2
Influenza A by PCR: NEGATIVE
Influenza B by PCR: NEGATIVE
SARS Coronavirus 2 by RT PCR: POSITIVE — AB

## 2020-09-24 MED ORDER — PROMETHAZINE-DM 6.25-15 MG/5ML PO SYRP: 5.0000 mL | ORAL_SOLUTION | Freq: Four times a day (QID) | ORAL | 0 refills | Status: DC | PRN

## 2020-09-24 NOTE — ED Provider Notes (Signed)
[facility]    CSN: [id number] Arrival date & time: 09/24/20  1139      History   Chief Complaint Chief Complaint  Patient presents with  . Diarrhea  . Cough  . Headache  . Generalized Body Aches    HPI Garrett Horton is a 60 y.o. male presenting for approximately 1 week history of fatigue, body aches, feeling feverish, headaches, cough, congestion, sore throat, and diarrhea.  Patient has been exposed to his daughters who are both sick, 1 of which tested positive for COVID-19 and the other presented to [facility] urgent care today to get tested.  His daughter's boyfriend is also sick with similar symptoms.  He is getting a Covid test today as well.  Patient has not been vaccinated for COVID-19.  He denies any recorded temperature.  He says that he just feels "weak."  He denies any chest pain or breathing difficulty.  No abdominal pain, nausea or vomiting.  Patient has taken over-the-counter ibuprofen and Tylenol occasionally for his headaches and states that does help.  He is also occasionally taking over-the-counter decongestants.  He denies any worsening of symptoms and says that the first 3 days were the worst but he still continues to feel fatigued.  He does not have any history of COPD or asthma.  He does have history of GERD, renal stones, and hernia repair.  He has no other complaints or concerns today.  HPI  Past Medical History:  Diagnosis Date  . GERD (gastroesophageal reflux disease)   . Hernia, inguinal, bilateral   . History of kidney stones     Patient Active Problem List   Diagnosis Date Noted  . Colon cancer screening   . Benign neoplasm of transverse colon   . Benign neoplasm of ascending colon   . Benign neoplasm of descending colon   . Gastroesophageal reflux disease   . Ureteral stone with hydronephrosis 03/02/2015    Past Surgical History:  Procedure Laterality Date  . COLONOSCOPY WITH PROPOFOL N/A 08/07/2018   Procedure: COLONOSCOPY WITH  BIOPSIES;  Surgeon: Lucilla Lame, MD;  Location: [place];  Service: Endoscopy;  Laterality: N/A;  . CYSTOSCOPY W/ RETROGRADES Left 03/02/2015   Procedure: CYSTOSCOPY WITH RETROGRADE PYELOGRAM;  Surgeon: Alexis Frock, MD;  Location: [facility];  Service: Urology;  Laterality: Left;  . CYSTOSCOPY W/ RETROGRADES N/A 03/21/2015   Procedure: CYSTOSCOPY WITH RETROGRADE PYELOGRAM;  Surgeon: Ardis Hughs, MD;  Location: [facility];  Service: Urology;  Laterality: N/A;  . CYSTOSCOPY WITH STENT PLACEMENT Left 03/02/2015   Procedure: CYSTOSCOPY WITH STENT PLACEMENT;  Surgeon: Alexis Frock, MD;  Location: [facility];  Service: Urology;  Laterality: Left;  . CYSTOSCOPY/URETEROSCOPY/HOLMIUM LASER/STENT PLACEMENT Left 03/21/2015   Procedure: CYSTOSCOPY/URETEROSCOPY/STENT EXCHANGE ;  Surgeon: Ardis Hughs, MD;  Location: [facility];  Service: Urology;  Laterality: Left;  . ESOPHAGOGASTRODUODENOSCOPY (EGD) WITH PROPOFOL N/A 08/07/2018   Procedure: ESOPHAGOGASTRODUODENOSCOPY (EGD) WITH PROPOFOL;  Surgeon: Lucilla Lame, MD;  Location: [place];  Service: Endoscopy;  Laterality: N/A;  . HERNIA REPAIR     inguinal  . POLYPECTOMY N/A 08/07/2018   Procedure: POLYPECTOMY;  Surgeon: Lucilla Lame, MD;  Location: [place];  Service: Endoscopy;  Laterality: N/A;  . VASECTOMY         Home Medications    Prior to Admission medications   Medication Sig Start Date End Date Taking? Authorizing Provider  naproxen sodium (ALEVE) 220 MG tablet Take 440 mg by mouth daily as needed (pain).  Yes [provider]  pantoprazole (PROTONIX) 40 MG tablet TAKE 1 TABLET BY MOUTH ONCE DAILY 08/17/20  Yes Lucilla Lame, MD  promethazine-dextromethorphan (PROMETHAZINE-DM) 6.25-15 MG/5ML syrup Take 5 mLs by mouth 4 (four) times daily as needed for cough. 09/24/20  Yes Danton Clap, PA-C  Carboxymethylcellul-Glycerin (LUBRICATING EYE DROPS OP) Place 1 drop into both eyes daily as needed (dry  eyes).    [provider]    Family History Family History  Problem Relation Age of Onset  . Nephrolithiasis Father   . Nephrolithiasis Brother   . Prostate cancer Neg Hx   . Bladder Cancer Neg Hx     Social History Social History   Tobacco Use  . Smoking status: Former Smoker    Quit date: 1999    Years since quitting: 23.3  . Smokeless tobacco: Current User    Types: Snuff  Vaping Use  . Vaping Use: Never used  Substance Use Topics  . Alcohol use: Yes    Alcohol/week: 0.0 standard drinks    Comment: occasional=can go months with no alcohol, then 1-3 drinks in a month..  . Drug use: No     Allergies   Patient has no known allergies.   Review of Systems Review of Systems  Constitutional: Positive for fatigue. Negative for fever.  HENT: Positive for congestion, rhinorrhea and sore throat. Negative for sinus pressure and sinus pain.   Respiratory: Positive for cough. Negative for shortness of breath.   Gastrointestinal: Positive for diarrhea. Negative for abdominal pain, nausea and vomiting.  Musculoskeletal: Positive for myalgias.  Neurological: Positive for headaches. Negative for weakness and light-headedness.  Hematological: Negative for adenopathy.     Physical Exam Triage Vital Signs ED Triage Vitals  Enc Vitals Group     BP 09/24/20 1156 140/87     Pulse Rate 09/24/20 1156 60     Resp 09/24/20 1156 18     Temp 09/24/20 1156 98.1 F (36.7 C)     Temp Source 09/24/20 1156 Oral     SpO2 09/24/20 1156 98 %     Weight 09/24/20 1158 195 lb (88.5 kg)     Height 09/24/20 1158 5\' 11"  (1.803 m)     Head Circumference --      Peak Flow --      Pain Score 09/24/20 1156 5     Pain Loc --      Pain Edu? --      Excl. in [place]? --    No data found.  Updated Vital Signs BP 140/87 (BP Location: Left Arm)   Pulse 60   Temp 98.1 F (36.7 C) (Oral)   Resp 18   Ht 5\' 11"  (1.803 m)   Wt 195 lb (88.5 kg)   SpO2 98%   BMI 27.20 kg/m       Physical  Exam Vitals and nursing note reviewed.  Constitutional:      General: He is not in acute distress.    Appearance: Normal appearance. He is well-developed. He is ill-appearing. He is not toxic-appearing or diaphoretic.  HENT:     Head: Normocephalic and atraumatic.     Nose: Congestion and rhinorrhea present.     Mouth/Throat:     Mouth: Mucous membranes are moist.     Pharynx: Oropharynx is clear. Uvula midline. No oropharyngeal exudate or posterior oropharyngeal erythema.     Tonsils: No tonsillar abscesses.  Eyes:     General: No scleral icterus.  Right eye: No discharge.        Left eye: No discharge.     Conjunctiva/sclera: Conjunctivae normal.     Pupils: Pupils are equal, round, and reactive to light.  Neck:     Thyroid: No thyromegaly.     Trachea: No tracheal deviation.  Cardiovascular:     Rate and Rhythm: Normal rate and regular rhythm.     Heart sounds: Normal heart sounds.  Pulmonary:     Effort: Pulmonary effort is normal. No respiratory distress.     Breath sounds: Normal breath sounds. No wheezing, rhonchi or rales.  Abdominal:     Palpations: Abdomen is soft.     Tenderness: There is no abdominal tenderness.  Musculoskeletal:     Cervical back: Normal range of motion and neck supple.  Lymphadenopathy:     Cervical: No cervical adenopathy.  Skin:    General: Skin is warm and dry.     Findings: No rash.  Neurological:     General: No focal deficit present.     Mental Status: He is alert. Mental status is at baseline.     Motor: No weakness.     Gait: Gait normal.  Psychiatric:        Mood and Affect: Mood is depressed.        Behavior: Behavior normal.        Thought Content: Thought content normal.      UC Treatments / Results  Labs (all labs ordered are listed, but only abnormal results are displayed) Labs Reviewed  RESP PANEL BY RT-PCR (FLU A&B, COVID) ARPGX2 - Abnormal; Notable for the following components:      Result Value   SARS  Coronavirus 2 by RT PCR POSITIVE (*)    All other components within normal limits    EKG   Radiology No results found.  Procedures Procedures (including critical care time)  Medications Ordered in UC Medications - No data to display  Initial Impression / Assessment and Plan / UC Course  I have reviewed the triage vital signs and the nursing notes.  Pertinent labs & imaging results that were available during my care of the patient were reviewed by me and considered in my medical decision making (see chart for details).   60 year old male presenting for 1 week history of cough, congestion, sore throat, fatigue, headaches, body aches and diarrhea.  Positive exposure to COVID-19.  Not vaccinated for COVID-19.  All vitals are normal and stable in the clinic.  He is ill-appearing, but nontoxic.  He is still well dressed.  Exam significant for nasal congestion or rhinorrhea as well as a somewhat depressed mood.  He is slow to answer questions.  His chest is clear to auscultation heart regular rate and rhythm.  Respiratory panel obtained today.  Covid test positive.  Reviewed this with patient.  Offered him referral to [facility] clinic for treatment including antibiotic therapy but he declined.  I reviewed CDC guidelines, isolation protocol and ED precautions with patient.  Advised him that since he is already been sick 7 days he needs to wear his mask for 3 more days.  Advised him that those are the CDC guidelines.  He states "Well I am illiterate." Patient is not illiterate.   Advised supportive care with increasing rest and fluids.  I did offer a cough syrup but he declined.  Printed a prescription for Promethazine DM.  Advised to continue Tylenol and/or Motrin for the headaches.  Reviewed ED red flag  signs and symptoms with patient.  Final Clinical Impressions(s) / UC Diagnoses   Final diagnoses:  COVID-19  Cough  Diarrhea, unspecified type  Fatigue, unspecified type     Discharge  Instructions     COVID +  You have received COVID testing today either for positive exposure, concerning symptoms that could be related to COVID infection, screening purposes, or re-testing after confirmed positive.  Your test obtained today checks for active viral infection in the last 1-2 weeks. If your test is negative now, you can still test positive later. So, if you do develop symptoms you should either get re-tested and/or isolate x 5 days and then strict mask use x 5 days (unvaccinated) or mask use x 10 days (vaccinated). Please follow CDC guidelines.  While Rapid antigen tests come back in 15-20 minutes, send out PCR/molecular test results typically come back within 1-3 days. In the mean time, if you are symptomatic, assume this could be a positive test and treat/monitor yourself as if you do have COVID.   We will call with test results if positive. Please download the MyChart app and set up a profile to access test results.   If symptomatic, go home and rest. Push fluids. Take Tylenol as needed for discomfort. Gargle warm salt water. Throat lozenges. Take Mucinex DM or Robitussin for cough. Humidifier in bedroom to ease coughing. Warm showers. Also review the COVID handout for more information.  COVID-19 INFECTION: The incubation period of COVID-19 is approximately 14 days after exposure, with most symptoms developing in roughly 4-5 days. Symptoms may range in severity from mild to critically severe. Roughly 80% of those infected will have mild symptoms. People of any age may become infected with COVID-19 and have the ability to transmit the virus. The most common symptoms include: fever, fatigue, cough, body aches, headaches, sore throat, nasal congestion, shortness of breath, nausea, vomiting, diarrhea, changes in smell and/or taste.    COURSE OF ILLNESS Some patients may begin with mild disease which can progress quickly into critical symptoms. If your symptoms are worsening please call  ahead to the Emergency Department and proceed there for further treatment. Recovery time appears to be roughly 1-2 weeks for mild symptoms and 3-6 weeks for severe disease.   GO IMMEDIATELY TO ER FOR FEVER YOU ARE UNABLE TO GET DOWN WITH TYLENOL, BREATHING PROBLEMS, CHEST PAIN, FATIGUE, LETHARGY, INABILITY TO EAT OR DRINK, ETC  QUARANTINE AND ISOLATION: To help decrease the spread of COVID-19 please remain isolated if you have COVID infection or are highly suspected to have COVID infection. This means -stay home and isolate to one room in the home if you live with others. Do not share a bed or bathroom with others while ill, sanitize and wipe down all countertops and keep common areas clean and disinfected. Stay home for 5 days. If you have no symptoms or your symptoms are resolving after 5 days, you can leave your house. Continue to wear a mask around others for 5 additional days. If you have been in close contact (within 6 feet) of someone diagnosed with COVID 19, you are advised to quarantine in your home for 14 days as symptoms can develop anywhere from 2-14 days after exposure to the virus. If you develop symptoms, you  must isolate.  Most current guidelines for COVID after exposure -unvaccinated: isolate 5 days and strict mask use x 5 days. Test on day 5 is possible -vaccinated: wear mask x 10 days if symptoms do not develop -You  do not necessarily need to be tested for COVID if you have + exposure and  develop symptoms. Just isolate at home x10 days from symptom onset During this global pandemic, CDC advises to practice social distancing, try to stay at least 15ft away from others at all times. Wear a face covering. Wash and sanitize your hands regularly and avoid going anywhere that is not necessary.  KEEP IN MIND THAT THE COVID TEST IS NOT 100% ACCURATE AND YOU SHOULD STILL DO EVERYTHING TO PREVENT POTENTIAL SPREAD OF VIRUS TO OTHERS (WEAR MASK, WEAR GLOVES, [place] HANDS AND SANITIZE REGULARLY).  IF INITIAL TEST IS NEGATIVE, THIS MAY NOT MEAN YOU ARE DEFINITELY NEGATIVE. MOST ACCURATE TESTING IS DONE 5-7 DAYS AFTER EXPOSURE.   It is not advised by CDC to get re-tested after receiving a positive COVID test since you can still test positive for weeks to months after you have already cleared the virus.   *If you have not been vaccinated for COVID, I strongly suggest you consider getting vaccinated as long as there are no contraindications.      ED Prescriptions    Medication Sig Dispense Auth. Provider   promethazine-dextromethorphan (PROMETHAZINE-DM) 6.25-15 MG/5ML syrup Take 5 mLs by mouth 4 (four) times daily as needed for cough. 150 mL Danton Clap, PA-C     PDMP not reviewed this encounter.   Danton Clap, PA-C 09/24/20 1257

## 2020-09-24 NOTE — ED Triage Notes (Signed)
Patient in today c/o diarrhea, headache, body aches and cough x 1 week. Patient states he has felt feverish, but hasn't taken his temperature. Patient has taken OTC Aleve and Tylenol. Patient has not had the covid vaccines. Patient states his daughter was sick last week, unsure if it was covid. Patient has a covid test at work on Friday (09/22/20) and it was negative.

## 2020-09-24 NOTE — Discharge Instructions (Signed)
+ COVID  You have received COVID testing today either for positive exposure, concerning symptoms that could be related to COVID infection, screening purposes, or re-testing after confirmed positive.  Your test obtained today checks for active viral infection in the last 1-2 weeks. If your test is negative now, you can still test positive later. So, if you do develop symptoms you should either get re-tested and/or isolate x 5 days and then strict mask use x 5 days (unvaccinated) or mask use x 10 days (vaccinated). Please follow CDC guidelines.  While Rapid antigen tests come back in 15-20 minutes, send out PCR/molecular test results typically come back within 1-3 days. In the mean time, if you are symptomatic, assume this could be a positive test and treat/monitor yourself as if you do have COVID.   We will call with test results if positive. Please download the MyChart app and set up a profile to access test results.   If symptomatic, go home and rest. Push fluids. Take Tylenol as needed for discomfort. Gargle warm salt water. Throat lozenges. Take Mucinex DM or Robitussin for cough. Humidifier in bedroom to ease coughing. Warm showers. Also review the COVID handout for more information.  COVID-19 INFECTION: The incubation period of COVID-19 is approximately 14 days after exposure, with most symptoms developing in roughly 4-5 days. Symptoms may range in severity from mild to critically severe. Roughly 80% of those infected will have mild symptoms. People of any age may become infected with COVID-19 and have the ability to transmit the virus. The most common symptoms include: fever, fatigue, cough, body aches, headaches, sore throat, nasal congestion, shortness of breath, nausea, vomiting, diarrhea, changes in smell and/or taste.    COURSE OF ILLNESS Some patients may begin with mild disease which can progress quickly into critical symptoms. If your symptoms are worsening please call ahead to the  Emergency Department and proceed there for further treatment. Recovery time appears to be roughly 1-2 weeks for mild symptoms and 3-6 weeks for severe disease.   GO IMMEDIATELY TO ER FOR FEVER YOU ARE UNABLE TO GET DOWN WITH TYLENOL, BREATHING PROBLEMS, CHEST PAIN, FATIGUE, LETHARGY, INABILITY TO EAT OR DRINK, ETC  QUARANTINE AND ISOLATION: To help decrease the spread of COVID-19 please remain isolated if you have COVID infection or are highly suspected to have COVID infection. This means -stay home and isolate to one room in the home if you live with others. Do not share a bed or bathroom with others while ill, sanitize and wipe down all countertops and keep common areas clean and disinfected. Stay home for 5 days. If you have no symptoms or your symptoms are resolving after 5 days, you can leave your house. Continue to wear a mask around others for 5 additional days. If you have been in close contact (within 6 feet) of someone diagnosed with COVID 19, you are advised to quarantine in your home for 14 days as symptoms can develop anywhere from 2-14 days after exposure to the virus. If you develop symptoms, you  must isolate.  Most current guidelines for COVID after exposure -unvaccinated: isolate 5 days and strict mask use x 5 days. Test on day 5 is possible -vaccinated: wear mask x 10 days if symptoms do not develop -You do not necessarily need to be tested for COVID if you have + exposure and  develop symptoms. Just isolate at home x10 days from symptom onset During this global pandemic, CDC advises to practice social distancing, try to   stay at least 6ft away from others at all times. Wear a face covering. Wash and sanitize your hands regularly and avoid going anywhere that is not necessary.  KEEP IN MIND THAT THE COVID TEST IS NOT 100% ACCURATE AND YOU SHOULD STILL DO EVERYTHING TO PREVENT POTENTIAL SPREAD OF VIRUS TO OTHERS (WEAR MASK, WEAR GLOVES, WASH HANDS AND SANITIZE REGULARLY). IF INITIAL  TEST IS NEGATIVE, THIS MAY NOT MEAN YOU ARE DEFINITELY NEGATIVE. MOST ACCURATE TESTING IS DONE 5-7 DAYS AFTER EXPOSURE.   It is not advised by CDC to get re-tested after receiving a positive COVID test since you can still test positive for weeks to months after you have already cleared the virus.   *If you have not been vaccinated for COVID, I strongly suggest you consider getting vaccinated as long as there are no contraindications.   

## 2020-09-25 ENCOUNTER — Telehealth: Payer: Self-pay

## 2020-09-25 NOTE — Telephone Encounter (Signed)
Called to discuss with patient about COVID-19 symptoms and the use of one of the available treatments for those with mild to moderate Covid symptoms and at a high risk of hospitalization.  Pt appears to qualify for outpatient treatment due to co-morbid conditions and/or a member of an at-risk group in accordance with the FDA Emergency Use Authorization.    Symptom onset: 09/17/20 Diarrhea,cough,headache Vaccinated: No Booster? No Immunocompromised? No Qualifiers:   Unable to reach pt - Left message and call back number [phone].   Marcello Moores

## 2021-01-23 ENCOUNTER — Other Ambulatory Visit: Payer: Self-pay | Admitting: Gastroenterology

## 2021-10-30 ENCOUNTER — Other Ambulatory Visit: Payer: Self-pay

## 2021-10-30 DIAGNOSIS — Z86010 Personal history of colonic polyps: Secondary | ICD-10-CM

## 2021-10-30 MED ORDER — PEG 3350-KCL-NA BICARB-NACL 420 G PO SOLR: 4000.0000 mL | Freq: Once | ORAL | 0 refills | Status: AC

## 2021-10-30 NOTE — Progress Notes (Signed)
Gastroenterology Pre-Procedure Review Will drop by with insurance card  Request Date: 12/07/2021 Requesting Physician: Dr. Allen Norris  PATIENT REVIEW QUESTIONS: The patient responded to the following health history questions as indicated:    1. Are you having any GI issues? no 2. Do you have a personal history of Polyps? yes (last colonoscopy) 3. Do you have a family history of Colon Cancer or Polyps? no 4. Diabetes Mellitus? no 5. Joint replacements in the past 12 months?no 6. Major health problems in the past 3 months?no 7. Any artificial heart valves, MVP, or defibrillator?no    MEDICATIONS & ALLERGIES:    Patient reports the following regarding taking any anticoagulation/antiplatelet therapy:   Plavix, Coumadin, Eliquis, Xarelto, Lovenox, Pradaxa, Brilinta, or Effient? no Aspirin? no  Patient confirms/reports the following medications:  Current Outpatient Medications  Medication Sig Dispense Refill   Carboxymethylcellul-Glycerin (LUBRICATING EYE DROPS OP) Place 1 drop into both eyes daily as needed (dry eyes).     naproxen sodium (ALEVE) 220 MG tablet Take 440 mg by mouth daily as needed (pain).     pantoprazole (PROTONIX) 40 MG tablet TAKE 1 TABLET BY MOUTH ONCE DAILY 30 tablet 3   promethazine-dextromethorphan (PROMETHAZINE-DM) 6.25-15 MG/5ML syrup Take 5 mLs by mouth 4 (four) times daily as needed for cough. 150 mL 0   No current facility-administered medications for this visit.    Patient confirms/reports the following allergies:  No Known Allergies  No orders of the defined types were placed in this encounter.   AUTHORIZATION INFORMATION Primary Insurance: 1D#: Group #:  Secondary Insurance: 1D#: Group #:  SCHEDULE INFORMATION: Date: 12/07/2021 Time: Location:[facility]

## 2021-11-28 ENCOUNTER — Encounter: Payer: Self-pay | Admitting: Gastroenterology

## 2021-12-07 ENCOUNTER — Encounter
Admission: RE | Disposition: A | Discharge status: Home / Self Care | Payer: Self-pay | Source: Ambulatory Visit | Attending: Gastroenterology

## 2021-12-07 ENCOUNTER — Other Ambulatory Visit: Payer: Self-pay

## 2021-12-07 ENCOUNTER — Ambulatory Visit: Payer: 59 | Admitting: Anesthesiology

## 2021-12-07 ENCOUNTER — Ambulatory Visit
Admission: RE | Admit: 2021-12-07 | Discharge: 2021-12-07 | Disposition: A | Discharge status: Home / Self Care | Payer: 59 | Source: Ambulatory Visit | Attending: Gastroenterology | Admitting: Gastroenterology

## 2021-12-07 ENCOUNTER — Encounter: Payer: Self-pay | Admitting: Gastroenterology

## 2021-12-07 DIAGNOSIS — K219 Gastro-esophageal reflux disease without esophagitis: Secondary | ICD-10-CM | POA: Insufficient documentation

## 2021-12-07 DIAGNOSIS — K640 First degree hemorrhoids: Secondary | ICD-10-CM | POA: Diagnosis not present

## 2021-12-07 DIAGNOSIS — D120 Benign neoplasm of cecum: Secondary | ICD-10-CM | POA: Insufficient documentation

## 2021-12-07 DIAGNOSIS — Z86010 Personal history of colonic polyps: Secondary | ICD-10-CM

## 2021-12-07 DIAGNOSIS — Z09 Encounter for follow-up examination after completed treatment for conditions other than malignant neoplasm: Secondary | ICD-10-CM | POA: Insufficient documentation

## 2021-12-07 DIAGNOSIS — K635 Polyp of colon: Secondary | ICD-10-CM | POA: Diagnosis not present

## 2021-12-07 DIAGNOSIS — Z87891 Personal history of nicotine dependence: Secondary | ICD-10-CM | POA: Diagnosis not present

## 2021-12-07 DIAGNOSIS — K5730 Diverticulosis of large intestine without perforation or abscess without bleeding: Secondary | ICD-10-CM | POA: Diagnosis not present

## 2021-12-07 DIAGNOSIS — D123 Benign neoplasm of transverse colon: Secondary | ICD-10-CM | POA: Insufficient documentation

## 2021-12-07 HISTORY — PX: POLYPECTOMY: SHX5525

## 2021-12-07 HISTORY — PX: COLONOSCOPY WITH PROPOFOL: SHX5780

## 2021-12-07 SURGERY — COLONOSCOPY WITH PROPOFOL
Anesthesia: General | Site: Rectum

## 2021-12-07 MED ORDER — STERILE WATER FOR IRRIGATION IR SOLN
Status: DC | PRN
  Administered 2021-12-07 (×2): 60 mL

## 2021-12-07 MED ORDER — STERILE WATER FOR IRRIGATION IR SOLN
Status: DC | PRN
  Administered 2021-12-07: 100 mL

## 2021-12-07 MED ORDER — OXYCODONE HCL 5 MG/5ML PO SOLN: 5.0000 mg | Freq: Once | ORAL | Status: DC | PRN

## 2021-12-07 MED ORDER — LACTATED RINGERS IV SOLN: INTRAVENOUS | Status: DC

## 2021-12-07 MED ORDER — SODIUM CHLORIDE 0.9 % IV SOLN: INTRAVENOUS | Status: DC

## 2021-12-07 MED ORDER — OXYCODONE HCL 5 MG PO TABS: 5.0000 mg | ORAL_TABLET | Freq: Once | ORAL | Status: DC | PRN

## 2021-12-07 MED ORDER — LIDOCAINE HCL (CARDIAC) PF 100 MG/5ML IV SOSY
PREFILLED_SYRINGE | INTRAVENOUS | Status: DC | PRN
  Administered 2021-12-07: 30 mg via INTRAVENOUS

## 2021-12-07 MED ORDER — PROPOFOL 10 MG/ML IV BOLUS
INTRAVENOUS | Status: DC | PRN
  Administered 2021-12-07 (×2): 40 mg via INTRAVENOUS
  Administered 2021-12-07: 30 mg via INTRAVENOUS
  Administered 2021-12-07: 150 mg via INTRAVENOUS
  Administered 2021-12-07: 40 mg via INTRAVENOUS

## 2021-12-07 SURGICAL SUPPLY — 8 items
GOWN CVR UNV OPN BCK APRN NK (MISCELLANEOUS) ×2 IMPLANT
GOWN ISOL THUMB LOOP REG UNIV (MISCELLANEOUS) ×4
KIT PRC NS LF DISP ENDO (KITS) ×1 IMPLANT
KIT PROCEDURE OLYMPUS (KITS) ×2
MANIFOLD NEPTUNE II (INSTRUMENTS) ×2 IMPLANT
SNARE COLD EXACTO (MISCELLANEOUS) ×1 IMPLANT
TRAP ETRAP POLY (MISCELLANEOUS) ×1 IMPLANT
WATER STERILE IRR 250ML POUR (IV SOLUTION) ×2 IMPLANT

## 2021-12-07 NOTE — Anesthesia Postprocedure Evaluation (Signed)
Anesthesia Post Note  Patient: Garrett Horton  Procedure(s) Performed: COLONOSCOPY WITH BIOPSY (Rectum) POLYPECTOMY (Rectum)     Patient location during evaluation: PACU Anesthesia Type: General Level of consciousness: awake and alert Pain management: pain level controlled Vital Signs Assessment: post-procedure vital signs reviewed and stable Respiratory status: spontaneous breathing, nonlabored ventilation, respiratory function stable and patient connected to nasal cannula oxygen Cardiovascular status: blood pressure returned to baseline and stable Postop Assessment: no apparent nausea or vomiting Anesthetic complications: no   No notable events documented.  Fidel Levy

## 2021-12-07 NOTE — Anesthesia Procedure Notes (Signed)
Date/Time: 12/07/2021 10:28 AM  Performed by: Cameron Ali, CRNAPre-anesthesia Checklist: Patient identified, Emergency Drugs available, Suction available, Timeout performed and Patient being monitored Patient Re-evaluated:Patient Re-evaluated prior to induction Oxygen Delivery Method: Nasal cannula Placement Confirmation: positive ETCO2

## 2021-12-07 NOTE — Op Note (Signed)
[facility] Gastroenterology Patient Name: Garrett Horton Procedure Date: 12/07/2021 10:25 AM MRN: [id number] Account #: [id number] Date of Birth: 1960-07-28 Admit Type: Outpatient Age: 61 Room: [facility] OR ROOM 01 Gender: Male Note Status: Finalized Instrument Name: [id number] Procedure:             Colonoscopy Indications:           High risk colon cancer surveillance: Personal history                         of colonic polyps Providers:             Lucilla Lame MD, MD Medicines:             Propofol per Anesthesia Complications:         No immediate complications. Procedure:             Pre-Anesthesia Assessment:                        - Prior to the procedure, a History and Physical was                         performed, and patient medications and allergies were                         reviewed. The patient's tolerance of previous                         anesthesia was also reviewed. The risks and benefits                         of the procedure and the sedation options and risks                         were discussed with the patient. All questions were                         answered, and informed consent was obtained. Prior                         Anticoagulants: The patient has taken no previous                         anticoagulant or antiplatelet agents. ASA Grade                         Assessment: II - A patient with mild systemic disease.                         After reviewing the risks and benefits, the patient                         was deemed in satisfactory condition to undergo the                         procedure.                        After obtaining informed consent, the colonoscope was  passed under direct vision. Throughout the procedure,                         the patient's blood pressure, pulse, and oxygen                         saturations were monitored continuously. The                         Colonoscope was  introduced through the anus and                         advanced to the the cecum, identified by appendiceal                         orifice and ileocecal valve. The colonoscopy was                         performed without difficulty. The patient tolerated                         the procedure well. The quality of the bowel                         preparation was excellent. Findings:      The perianal and digital rectal examinations were normal.      A 3 mm polyp was found in the cecum. The polyp was sessile. The polyp       was removed with a cold snare. Resection and retrieval were complete.      A 4 mm polyp was found in the transverse colon. The polyp was sessile.       The polyp was removed with a cold snare. Resection and retrieval were       complete.      A few small-mouthed diverticula were found in the sigmoid colon.      Non-bleeding internal hemorrhoids were found during retroflexion. The       hemorrhoids were Grade I (internal hemorrhoids that do not prolapse). Impression:            - One 3 mm polyp in the cecum, removed with a cold                         snare. Resected and retrieved.                        - One 4 mm polyp in the transverse colon, removed with                         a cold snare. Resected and retrieved.                        - Diverticulosis in the sigmoid colon.                        - Non-bleeding internal hemorrhoids. Recommendation:        - Discharge patient to home.                        - Resume  previous diet.                        - Continue present medications.                        - Await pathology results.                        - Repeat colonoscopy in 7 years for surveillance. Procedure Code(s):     --- Professional ---                        [phone], Colonoscopy, flexible; with removal of                         tumor(s), polyp(s), or other lesion(s) by snare                         technique Diagnosis Code(s):     --- Professional  ---                        Z86.010, Personal history of colonic polyps                        K63.5, Polyp of colon CPT copyright 2019 American Medical Association. All rights reserved. The codes documented in this report are preliminary and upon coder review may  be revised to meet current compliance requirements. Lucilla Lame MD, MD 12/07/2021 10:46:46 AM This report has been signed electronically. Number of Addenda: 0 Note Initiated On: 12/07/2021 10:25 AM Scope Withdrawal Time: 0 hours 8 minutes 49 seconds  Total Procedure Duration: 0 hours 12 minutes 28 seconds  Estimated Blood Loss:  Estimated blood loss: none.      [facility]

## 2021-12-07 NOTE — H&P (Signed)
Lucilla Lame, MD [address] [place], [place] [address] Phone:[phone] Fax : [phone]  Primary Care Physician:  Patient, No Pcp Per Primary Gastroenterologist:  Dr. Allen Norris  Pre-Procedure History & Physical: HPI:  Garrett Horton is a 61 y.o. male is here for an colonoscopy.   Past Medical History:  Diagnosis Date   GERD (gastroesophageal reflux disease)    Hernia, inguinal, bilateral    History of kidney stones     Past Surgical History:  Procedure Laterality Date   COLONOSCOPY WITH PROPOFOL N/A 08/07/2018   Procedure: COLONOSCOPY WITH BIOPSIES;  Surgeon: Lucilla Lame, MD;  Location: [place];  Service: Endoscopy;  Laterality: N/A;   CYSTOSCOPY W/ RETROGRADES Left 03/02/2015   Procedure: CYSTOSCOPY WITH RETROGRADE PYELOGRAM;  Surgeon: Alexis Frock, MD;  Location: [facility];  Service: Urology;  Laterality: Left;   CYSTOSCOPY W/ RETROGRADES N/A 03/21/2015   Procedure: CYSTOSCOPY WITH RETROGRADE PYELOGRAM;  Surgeon: Ardis Hughs, MD;  Location: [facility];  Service: Urology;  Laterality: N/A;   CYSTOSCOPY WITH STENT PLACEMENT Left 03/02/2015   Procedure: CYSTOSCOPY WITH STENT PLACEMENT;  Surgeon: Alexis Frock, MD;  Location: [facility];  Service: Urology;  Laterality: Left;   CYSTOSCOPY/URETEROSCOPY/HOLMIUM LASER/STENT PLACEMENT Left 03/21/2015   Procedure: CYSTOSCOPY/URETEROSCOPY/STENT EXCHANGE ;  Surgeon: Ardis Hughs, MD;  Location: [facility];  Service: Urology;  Laterality: Left;   ESOPHAGOGASTRODUODENOSCOPY (EGD) WITH PROPOFOL N/A 08/07/2018   Procedure: ESOPHAGOGASTRODUODENOSCOPY (EGD) WITH PROPOFOL;  Surgeon: Lucilla Lame, MD;  Location: [place];  Service: Endoscopy;  Laterality: N/A;   HERNIA REPAIR     inguinal   POLYPECTOMY N/A 08/07/2018   Procedure: POLYPECTOMY;  Surgeon: Lucilla Lame, MD;  Location: [place];  Service: Endoscopy;  Laterality: N/A;   VASECTOMY      Prior to Admission medications    Medication Sig Start Date End Date Taking? Authorizing Provider  naproxen sodium (ALEVE) 220 MG tablet Take 440 mg by mouth daily as needed (pain).   Yes [provider]  pantoprazole (PROTONIX) 40 MG tablet TAKE 1 TABLET BY MOUTH ONCE DAILY 08/17/20  Yes Lucilla Lame, MD  promethazine-dextromethorphan (PROMETHAZINE-DM) 6.25-15 MG/5ML syrup Take 5 mLs by mouth 4 (four) times daily as needed for cough. 09/24/20  Yes Laurene Footman B, PA-C    Allergies as of 10/30/2021   (No Known Allergies)    Family History  Problem Relation Age of Onset   Nephrolithiasis Father    Nephrolithiasis Brother    Prostate cancer Neg Hx    Bladder Cancer Neg Hx     Social History   Socioeconomic History   Marital status: Married    Spouse name: Joelene Millin   Number of children: 4   Years of education: Not on file   Highest education level: Not on file  Occupational History   Not on file  Tobacco Use   Smoking status: Former   Smokeless tobacco: Current    Types: Snuff  Vaping Use   Vaping Use: Never used  Substance and Sexual Activity   Alcohol use: Yes    Alcohol/week: 2.0 standard drinks of alcohol    Types: 2 Cans of beer per week    Comment: occasional=can go months with no alcohol, then 1-3 drinks in a month..   Drug use: No   Sexual activity: Not on file  Other Topics Concern   Not on file  Social History Narrative   Not on file   Social Determinants of Health   Financial  Resource Strain: Not on file  Food Insecurity: Not on file  Transportation Needs: Not on file  Physical Activity: Not on file  Stress: Not on file  Social Connections: Not on file  Intimate Partner Violence: Not on file    Review of Systems: See HPI, otherwise negative ROS  Physical Exam: BP (!) 133/97   Pulse 73   Temp 98.2 F (36.8 C)   Resp (!) 22   Ht '5\' 10"'$  (1.778 m)   Wt 86.6 kg   SpO2 96%   BMI 27.41 kg/m  General:   Alert,  pleasant and cooperative in NAD Head:  Normocephalic and  atraumatic. Neck:  Supple; no masses or thyromegaly. Lungs:  Clear throughout to auscultation.    Heart:  Regular rate and rhythm. Abdomen:  Soft, nontender and nondistended. Normal bowel sounds, without guarding, and without rebound.   Neurologic:  Alert and  oriented x4;  grossly normal neurologically.  Impression/Plan: Garrett Horton is here for an colonoscopy to be performed for a history of adenomatous polyps on 202   Risks, benefits, limitations, and alternatives regarding  colonoscopy have been reviewed with the patient.  Questions have been answered.  All parties agreeable.   Lucilla Lame, MD  12/07/2021, 9:40 AM

## 2021-12-07 NOTE — Anesthesia Preprocedure Evaluation (Signed)
Anesthesia Evaluation  Patient identified by MRN, date of birth, ID band Patient awake    Reviewed: NPO status   History of Anesthesia Complications (+) history of anesthetic complications  Airway Mallampati: II  TM Distance: >3 FB Neck ROM: full    Dental  (+) Poor Dentition   Pulmonary neg pulmonary ROS, former smoker,    Pulmonary exam normal        Cardiovascular Exercise Tolerance: Good negative cardio ROS Normal cardiovascular exam     Neuro/Psych negative neurological ROS  negative psych ROS   GI/Hepatic negative GI ROS, Neg liver ROS, GERD  Controlled,  Endo/Other  negative endocrine ROS  Renal/GU negative Renal ROS  negative genitourinary   Musculoskeletal   Abdominal   Peds  Hematology negative hematology ROS (+)   Anesthesia Other Findings   Reproductive/Obstetrics                             Anesthesia Physical Anesthesia Plan  ASA: 2  Anesthesia Plan: General   Post-op Pain Management:    Induction:   PONV Risk Score and Plan: 2 and TIVA and Propofol infusion  Airway Management Planned:   Additional Equipment:   Intra-op Plan:   Post-operative Plan:   Informed Consent: I have reviewed the patients History and Physical, chart, labs and discussed the procedure including the risks, benefits and alternatives for the proposed anesthesia with the patient or authorized representative who has indicated his/her understanding and acceptance.       Plan Discussed with: CRNA  Anesthesia Plan Comments:         Anesthesia Quick Evaluation

## 2021-12-07 NOTE — Anesthesia Procedure Notes (Deleted)
Date/Time: 12/07/2021 10:27 AM  Performed by: Cameron Ali, CRNAPre-anesthesia Checklist: Patient identified, Emergency Drugs available, Suction available, Timeout performed and Patient being monitored Patient Re-evaluated:Patient Re-evaluated prior to induction Oxygen Delivery Method: Nasal cannula Placement Confirmation: positive ETCO2

## 2021-12-07 NOTE — Transfer of Care (Signed)
Immediate Anesthesia Transfer of Care Note  Patient: Garrett Horton  Procedure(s) Performed: COLONOSCOPY WITH BIOPSY (Rectum) POLYPECTOMY (Rectum)  Patient Location: PACU  Anesthesia Type: General  Level of Consciousness: awake, alert  and patient cooperative  Airway and Oxygen Therapy: Patient Spontanous Breathing and Patient connected to supplemental oxygen  Post-op Assessment: Post-op Vital signs reviewed, Patient's Cardiovascular Status Stable, Respiratory Function Stable, Patent Airway and No signs of Nausea or vomiting  Post-op Vital Signs: Reviewed and stable  Complications: No notable events documented.

## 2021-12-10 ENCOUNTER — Encounter: Payer: Self-pay | Admitting: Gastroenterology

## 2021-12-10 LAB — SURGICAL PATHOLOGY

## 2021-12-11 ENCOUNTER — Encounter: Payer: Self-pay | Admitting: Gastroenterology

## 2022-09-23 ENCOUNTER — Ambulatory Visit
Admission: EM | Admit: 2022-09-23 | Discharge: 2022-09-23 | Disposition: A | Discharge status: Home / Self Care | Payer: 59 | Attending: Family Medicine | Admitting: Family Medicine

## 2022-09-23 ENCOUNTER — Encounter: Payer: Self-pay | Admitting: Emergency Medicine

## 2022-09-23 DIAGNOSIS — J069 Acute upper respiratory infection, unspecified: Secondary | ICD-10-CM

## 2022-09-23 MED ORDER — PROMETHAZINE-DM 6.25-15 MG/5ML PO SYRP: 5.0000 mL | ORAL_SOLUTION | Freq: Every evening | ORAL | 0 refills | Status: AC | PRN

## 2022-09-23 MED ORDER — AZITHROMYCIN 250 MG PO TABS: 250.0000 mg | ORAL_TABLET | Freq: Every day | ORAL | 0 refills | Status: AC

## 2022-09-23 MED ORDER — BENZONATATE 100 MG PO CAPS: 100.0000 mg | ORAL_CAPSULE | Freq: Three times a day (TID) | ORAL | 0 refills | Status: AC

## 2022-09-23 NOTE — ED Provider Notes (Signed)
[facility]    CSN: [id number] Arrival date & time: 09/23/22  1006      History   Chief Complaint Chief Complaint  Patient presents with   Cough   Fever   Headache   Nausea    HPI Garrett Horton is a 62 y.o. male.   Patient presents for evaluation of fever, nasal congestion, rhinorrhea, sore throat, cough, chest tightness, wheezing, nausea without vomiting and headaches present for 5 days.  Endorses symptoms are worsening.  Fever peaking at 102.  Throat feels raw and had been able to tolerate food and liquids until today.  Has attempted use of hot tea which has been minimally helpful.  No known sick contacts prior.  Former smoker but denies respiratory history.   Past Medical History:  Diagnosis Date   GERD (gastroesophageal reflux disease)    Hernia, inguinal, bilateral    History of kidney stones     Patient Active Problem List   Diagnosis Date Noted   History of colonic polyps    Polyp of transverse colon    Colon cancer screening    Benign neoplasm of transverse colon    Benign neoplasm of ascending colon    Benign neoplasm of descending colon    Gastroesophageal reflux disease    Ureteral stone with hydronephrosis 03/02/2015    Past Surgical History:  Procedure Laterality Date   COLONOSCOPY WITH PROPOFOL N/A 08/07/2018   Procedure: COLONOSCOPY WITH BIOPSIES;  Surgeon: Midge Minium, MD;  Location: [facility];  Service: Endoscopy;  Laterality: N/A;   COLONOSCOPY WITH PROPOFOL N/A 12/07/2021   Procedure: COLONOSCOPY WITH BIOPSY;  Surgeon: Midge Minium, MD;  Location: [facility];  Service: Endoscopy;  Laterality: N/A;   CYSTOSCOPY W/ RETROGRADES Left 03/02/2015   Procedure: CYSTOSCOPY WITH RETROGRADE PYELOGRAM;  Surgeon: Sebastian Ache, MD;  Location: [facility];  Service: Urology;  Laterality: Left;   CYSTOSCOPY W/ RETROGRADES N/A 03/21/2015   Procedure: CYSTOSCOPY WITH RETROGRADE PYELOGRAM;  Surgeon: Crist Fat, MD;  Location: [facility]  [facility];  Service: Urology;  Laterality: N/A;   CYSTOSCOPY WITH STENT PLACEMENT Left 03/02/2015   Procedure: CYSTOSCOPY WITH STENT PLACEMENT;  Surgeon: Sebastian Ache, MD;  Location: [facility];  Service: Urology;  Laterality: Left;   CYSTOSCOPY/URETEROSCOPY/HOLMIUM LASER/STENT PLACEMENT Left 03/21/2015   Procedure: CYSTOSCOPY/URETEROSCOPY/STENT EXCHANGE ;  Surgeon: Crist Fat, MD;  Location: [facility];  Service: Urology;  Laterality: Left;   ESOPHAGOGASTRODUODENOSCOPY (EGD) WITH PROPOFOL N/A 08/07/2018   Procedure: ESOPHAGOGASTRODUODENOSCOPY (EGD) WITH PROPOFOL;  Surgeon: Midge Minium, MD;  Location: [facility];  Service: Endoscopy;  Laterality: N/A;   HERNIA REPAIR     inguinal   POLYPECTOMY N/A 08/07/2018   Procedure: POLYPECTOMY;  Surgeon: Midge Minium, MD;  Location: [facility];  Service: Endoscopy;  Laterality: N/A;   POLYPECTOMY N/A 12/07/2021   Procedure: POLYPECTOMY;  Surgeon: Midge Minium, MD;  Location: [facility];  Service: Endoscopy;  Laterality: N/A;   VASECTOMY         Home Medications    Prior to Admission medications   Medication Sig Start Date End Date Taking? Authorizing Provider  pantoprazole (PROTONIX) 40 MG tablet TAKE 1 TABLET BY MOUTH ONCE DAILY 08/17/20  Yes Midge Minium, MD  naproxen sodium (ALEVE) 220 MG tablet Take 440 mg by mouth daily as needed (pain).    [provider]  promethazine-dextromethorphan (PROMETHAZINE-DM) 6.25-15 MG/5ML syrup Take 5 mLs by mouth 4 (four) times daily as needed for cough. 09/24/20   Michiel Cowboy,  Algis Greenhouse, PA-C    Family History Family History  Problem Relation Age of Onset   Nephrolithiasis Father    Nephrolithiasis Brother    Prostate cancer Neg Hx    Bladder Cancer Neg Hx     Social History Social History   Tobacco Use   Smoking status: Former   Smokeless tobacco: Current    Types: Snuff  Vaping Use   Vaping Use: Never used  Substance Use Topics   Alcohol use: Yes    Alcohol/week: 2.0  standard drinks of alcohol    Types: 2 Cans of beer per week    Comment: occasional=can go months with no alcohol, then 1-3 drinks in a month..   Drug use: No     Allergies   Patient has no known allergies.   Review of Systems Review of Systems  Constitutional:  Positive for fever. Negative for activity change, appetite change, chills, diaphoresis, fatigue and unexpected weight change.  HENT:  Positive for congestion, rhinorrhea and sore throat. Negative for dental problem, drooling, ear discharge, ear pain, facial swelling, hearing loss, mouth sores, nosebleeds, postnasal drip, sinus pressure, sinus pain, sneezing, tinnitus, trouble swallowing and voice change.   Respiratory:  Positive for cough, chest tightness (for 5 days) and wheezing. Negative for apnea, choking, shortness of breath and stridor.   Cardiovascular: Negative.   Gastrointestinal:  Positive for nausea. Negative for abdominal distention, abdominal pain, anal bleeding, blood in stool, constipation, diarrhea, rectal pain and vomiting.  Neurological:  Positive for headaches. Negative for dizziness, tremors, seizures, syncope, facial asymmetry, speech difficulty, weakness, light-headedness and numbness.     Physical Exam Triage Vital Signs ED Triage Vitals  Enc Vitals Group     BP 09/23/22 1044 (!) (P) 134/97     Pulse Rate 09/23/22 1042 84     Resp 09/23/22 1042 16     Temp 09/23/22 1042 98.2 F (36.8 C)     Temp Source 09/23/22 1042 Oral     SpO2 09/23/22 1042 98 %     Weight --      Height --      Head Circumference --      Peak Flow --      Pain Score 09/23/22 1041 7     Pain Loc --      Pain Edu? --      Excl. in GC? --    No data found.  Updated Vital Signs BP (!) (P) 134/97   Pulse 84   Temp 98.2 F (36.8 C) (Oral)   Resp 16   SpO2 98%   Visual Acuity Right Eye Distance:   Left Eye Distance:   Bilateral Distance:    Right Eye Near:   Left Eye Near:    Bilateral Near:     Physical  Exam Constitutional:      Appearance: Normal appearance.  HENT:     Head: Normocephalic.     Right Ear: Tympanic membrane, ear canal and external ear normal.     Left Ear: Tympanic membrane, ear canal and external ear normal.     Nose: Congestion and rhinorrhea present.     Mouth/Throat:     Mouth: Mucous membranes are moist.     Pharynx: No posterior oropharyngeal erythema.  Eyes:     Extraocular Movements: Extraocular movements intact.  Cardiovascular:     Rate and Rhythm: Normal rate and regular rhythm.     Pulses: Normal pulses.     Heart sounds: Normal heart sounds.  Pulmonary:     Effort: Pulmonary effort is normal.     Breath sounds: Normal breath sounds.  Skin:    General: Skin is warm and dry.  Neurological:     General: No focal deficit present.     Mental Status: He is alert and oriented to person, place, and time. Mental status is at baseline.  Psychiatric:        Mood and Affect: Mood normal.        Behavior: Behavior normal.      UC Treatments / Results  Labs (all labs ordered are listed, but only abnormal results are displayed) Labs Reviewed - No data to display  EKG   Radiology No results found.  Procedures Procedures (including critical care time)  Medications Ordered in UC Medications - No data to display  Initial Impression / Assessment and Plan / UC Course  I have reviewed the triage vital signs and the nursing notes.  Pertinent labs & imaging results that were available during my care of the patient were reviewed by me and considered in my medical decision making (see chart for details).  Acute URI  Patient is in no signs of distress nor toxic appearing.  Vital signs are stable.  Low suspicion for pneumonia, pneumothorax or bronchitis and therefore will defer imaging.  Viral testing deferred due to timeline of illness.  Prescribed azithromycin prophylactically as well as Tessalon and Promethazine DM as cough is the most worrisome symptom.   Declined use of prednisone and albuterol inhaler.May use additional over-the-counter medications as needed for supportive care.  May follow-up with urgent care as needed if symptoms persist or worsen.   Final Clinical Impressions(s) / UC Diagnoses   Final diagnoses:  None   Discharge Instructions   None    ED Prescriptions   None    PDMP not reviewed this encounter.   Valinda Hoar, NP 09/23/22 1116

## 2022-09-23 NOTE — Discharge Instructions (Signed)
Your symptoms today are most likely being caused by a virus and should steadily improve in time it can take up to 7 to 10 days before you truly start to see a turnaround however things will get better, you have been prophylactically placed on antibiotic to keep symptoms from worsening, take azithromycin as directed  You may use Tessalon Perles every 8 hours as needed to help calm your coughing  You may use cough syrup at bedtime for additional comfort, be mindful this will make you feel drowsy    You can take Tylenol and/or Ibuprofen as needed for fever reduction and pain relief.   For cough: honey 1/2 to 1 teaspoon (you can dilute the honey in water or another fluid).  You can also use guaifenesin for cough. You can use a humidifier for chest congestion and cough.  If you don't have a humidifier, you can sit in the bathroom with the hot shower running.      For sore throat: try warm salt water gargles, cepacol lozenges, throat spray, warm tea or water with lemon/honey, popsicles or ice, or OTC cold relief medicine for throat discomfort.   For congestion: take a daily anti-histamine like Zyrtec, Claritin, and a oral decongestant, such as pseudoephedrine.  You can also use Flonase 1-2 sprays in each nostril daily.   It is important to stay hydrated: drink plenty of fluids (water, gatorade/powerade/pedialyte, juices, or teas) to keep your throat moisturized and help further relieve irritation/discomfort.

## 2022-09-23 NOTE — ED Triage Notes (Signed)
Pt presents with a cough, chest tightness, fever, nausea and headache x 5 days. Pt has not tried anything for his symptoms.
